# Patient Record
Sex: Female | Born: 1970 | Race: White | Hispanic: No | Marital: Married | Smoking: Never smoker
Health system: Southern US, Community
[De-identification: ages and names within clinical notes are randomized; demographics above are authoritative.]

## PROBLEM LIST (undated history)

## (undated) DIAGNOSIS — R413 Other amnesia: Secondary | ICD-10-CM

## (undated) DIAGNOSIS — N809 Endometriosis, unspecified: Secondary | ICD-10-CM

## (undated) DIAGNOSIS — F3181 Bipolar II disorder: Secondary | ICD-10-CM

## (undated) HISTORY — PX: OTHER SURGICAL HISTORY: SHX169

## (undated) HISTORY — DX: Other amnesia: R41.3

## (undated) HISTORY — PX: DILATION AND CURETTAGE OF UTERUS: SHX78

## (undated) HISTORY — DX: Endometriosis, unspecified: N80.9

## (undated) HISTORY — DX: Bipolar II disorder: F31.81

---

## 1998-06-04 ENCOUNTER — Other Ambulatory Visit: Admission: RE | Admit: 1998-06-04 | Discharge: 1998-06-04 | Payer: Self-pay | Admitting: Gynecology

## 1999-08-10 ENCOUNTER — Other Ambulatory Visit: Admission: RE | Admit: 1999-08-10 | Discharge: 1999-08-10 | Payer: Self-pay | Admitting: Gynecology

## 2000-09-07 ENCOUNTER — Other Ambulatory Visit: Admission: RE | Admit: 2000-09-07 | Discharge: 2000-09-07 | Payer: Self-pay | Admitting: Gynecology

## 2001-09-10 ENCOUNTER — Other Ambulatory Visit: Admission: RE | Admit: 2001-09-10 | Discharge: 2001-09-10 | Payer: Self-pay | Admitting: Gynecology

## 2002-09-25 ENCOUNTER — Other Ambulatory Visit: Admission: RE | Admit: 2002-09-25 | Discharge: 2002-09-25 | Payer: Self-pay | Admitting: Obstetrics and Gynecology

## 2002-12-31 ENCOUNTER — Encounter: Payer: Self-pay | Admitting: Obstetrics and Gynecology

## 2002-12-31 ENCOUNTER — Inpatient Hospital Stay (HOSPITAL_COMMUNITY): Admission: AD | Admit: 2002-12-31 | Discharge: 2002-12-31 | Payer: Self-pay | Admitting: Obstetrics and Gynecology

## 2003-01-13 ENCOUNTER — Inpatient Hospital Stay (HOSPITAL_COMMUNITY): Admission: AD | Admit: 2003-01-13 | Discharge: 2003-01-13 | Payer: Self-pay | Admitting: Obstetrics and Gynecology

## 2003-04-15 ENCOUNTER — Inpatient Hospital Stay (HOSPITAL_COMMUNITY): Admission: AD | Admit: 2003-04-15 | Discharge: 2003-04-17 | Payer: Self-pay | Admitting: Obstetrics and Gynecology

## 2003-06-05 ENCOUNTER — Encounter: Payer: Self-pay | Admitting: Obstetrics and Gynecology

## 2003-06-05 ENCOUNTER — Encounter (INDEPENDENT_AMBULATORY_CARE_PROVIDER_SITE_OTHER): Payer: Self-pay

## 2003-06-05 ENCOUNTER — Ambulatory Visit (HOSPITAL_COMMUNITY): Admission: RE | Admit: 2003-06-05 | Discharge: 2003-06-05 | Payer: Self-pay | Admitting: Obstetrics and Gynecology

## 2003-09-30 ENCOUNTER — Other Ambulatory Visit: Admission: RE | Admit: 2003-09-30 | Discharge: 2003-09-30 | Payer: Self-pay | Admitting: Obstetrics and Gynecology

## 2004-09-30 ENCOUNTER — Other Ambulatory Visit: Admission: RE | Admit: 2004-09-30 | Discharge: 2004-09-30 | Payer: Self-pay | Admitting: Obstetrics and Gynecology

## 2005-05-30 ENCOUNTER — Ambulatory Visit (HOSPITAL_COMMUNITY): Admission: RE | Admit: 2005-05-30 | Discharge: 2005-05-30 | Payer: Self-pay | Admitting: Obstetrics and Gynecology

## 2005-08-27 ENCOUNTER — Inpatient Hospital Stay (HOSPITAL_COMMUNITY): Admission: AD | Admit: 2005-08-27 | Discharge: 2005-08-27 | Payer: Self-pay | Admitting: Obstetrics and Gynecology

## 2005-09-06 ENCOUNTER — Inpatient Hospital Stay (HOSPITAL_COMMUNITY): Admission: AD | Admit: 2005-09-06 | Discharge: 2005-09-08 | Payer: Self-pay | Admitting: Obstetrics and Gynecology

## 2005-10-14 ENCOUNTER — Inpatient Hospital Stay (HOSPITAL_COMMUNITY): Admission: AD | Admit: 2005-10-14 | Discharge: 2005-10-16 | Payer: Self-pay | Admitting: Obstetrics and Gynecology

## 2005-11-11 ENCOUNTER — Other Ambulatory Visit: Admission: RE | Admit: 2005-11-11 | Discharge: 2005-11-11 | Payer: Self-pay | Admitting: Obstetrics and Gynecology

## 2008-05-22 ENCOUNTER — Encounter: Admission: RE | Admit: 2008-05-22 | Discharge: 2008-05-22 | Payer: Self-pay | Admitting: Obstetrics and Gynecology

## 2008-05-27 ENCOUNTER — Encounter: Admission: RE | Admit: 2008-05-27 | Discharge: 2008-05-27 | Payer: Self-pay | Admitting: Obstetrics and Gynecology

## 2008-08-13 ENCOUNTER — Inpatient Hospital Stay (HOSPITAL_COMMUNITY): Admission: AD | Admit: 2008-08-13 | Discharge: 2008-08-13 | Payer: Self-pay | Admitting: Obstetrics and Gynecology

## 2008-11-07 ENCOUNTER — Ambulatory Visit (HOSPITAL_COMMUNITY): Admission: RE | Admit: 2008-11-07 | Discharge: 2008-11-07 | Payer: Self-pay | Admitting: Obstetrics and Gynecology

## 2010-09-19 ENCOUNTER — Encounter: Payer: Self-pay | Admitting: Obstetrics and Gynecology

## 2011-01-14 NOTE — H&P (Signed)
   [name] [name]                           ACCOUNT NO.:  [id number]   MEDICAL RECORD NO.:  [id number]                   PATIENT TYPE:  INP   LOCATION:  9106                                 [facility]   PHYSICIAN:  Osborn Coho, M.D.                DATE OF BIRTH:  [birth year]   DATE OF ADMISSION:  04/15/2003  DATE OF DISCHARGE:                                HISTORY & PHYSICAL   CONTINUATION:   PHYSICAL EXAMINATION:  VITAL SIGNS:  Vital signs are stable.  The patient is  afebrile.  HEENT:  Within normal limits.  LUNGS:  Bilateral breath sounds are clear.  HEART:  Regular rate and rhythm without murmur.  BREASTS:  Soft and nontender.  ABDOMEN:  The fundal height is approximately 37 cm.  Estimated fetal weight  6 to 6-1/2 pounds.  Uterine contractions are every two to four minutes and  moderate to strong quality.  PELVIC:  Cervix 7-8 cm on evaluation in maternity admissions.  The fetal  heart rate is in the 140s by Doppler.  EXTREMITIES:  The deep tendon reflexes are 2+ without clonus.  There is  trace edema noted.   IMPRESSION:  1. Intrauterine pregnancy at 39-1/7 weeks.  2. Active labor, essentially transitional.  3. Desires interventive labor.   PLAN:  1. Admit to birthing suite per consult with Osborn Coho, M.D., as     attending physician.  2. Routine certified nurse midwife orders.  3. The patient has a birth plan that prefers no intervention, including IV,     continuous electronic fetal monitoring, or medication.  This had been     reviewed with the patient in the office and this will be followed as much     as possible based on maternal/fetal status.     [name] [name], C.N.M.                   Osborn Coho, M.D.    VLL/MEDQ  D:  04/15/2003  T:  04/15/2003  Job:  [id number]

## 2011-01-14 NOTE — Discharge Summary (Signed)
[name] [name], [name] [name]                 ACCOUNT NO.:  [id number]   MEDICAL RECORD NO.:  [id number]          PATIENT TYPE:  INP   LOCATION:  9171                          [facility]   PHYSICIAN:  Janine Limbo, M.D.DATE OF BIRTH:  April 18, 1971   DATE OF ADMISSION:  09/06/2005  DATE OF DISCHARGE:  09/08/2005                                 DISCHARGE SUMMARY   ADMITTING DIAGNOSES:  1.  Intrauterine pregnancy at 33 weeks and 5 days.  2.  Preterm labor.   DISCHARGE DIAGNOSES:  1.  Intrauterine pregnancy at [redacted] weeks gestation.  2.  Preterm labor.  3.  Tocolysis.   PROCEDURES:  Magnesium tocolysis.   HOSPITAL COURSE:  Ms. Garn is a 40 year old gravida 2, para 1-0-0-1, who  was admitted at 33 weeks and 5 to [redacted] weeks gestation after evaluation in MAU  secondary to uterine contractions and findings of cervical exam of 3 cm, 80%  effaced, and -2 station.  The patient's pregnancy had been remarkable for  history of infertility with Clomid conception.  History of retained placenta  and negative fetal fibronectin on August 27, 2005.  On August 27, 2005,  the patient had presented to MAU with complaints of a prolonged contraction,  at which time fetal fibronectin and GBS, as well as gonorrhea and chlamydia  cultures were obtained, which were all negative.  The patient underwent  ultrasound at that time, which found a shortened cervix 1.6 cm with a  dilation of internal os to 3 to 4 cm.  Patient without contractions on the  monitor at that time, and she was discharged home.  The patient returned to  the office for routine follow-up OB visit, at which time cervical exam  revealed her cervix to be 3 cm, 80% effaced, and -2 station.  Cervical exam  on August 27, 2005, however, had been closed prior to ultrasound.  Since  this was felt to be a change, the patient was admitted for observation to  MAU.  While being observed in MAU, the patient was noted to have irregular  contractions at  times as often as every two to three minutes.  Therefore, a  decision was made to admit patient for magnesium tocolysis, as well as a  course of betamethasone.  The patient underwent a course of betamethasone.  Throughout patient's hospital stay, patient's contractions remained quite on  magnesium.  Magnesium discontinued six hours prior to discharge without  recurrence of contractions.  Cervical exam on discharge finds cervix to be  unchanged at 3 cm dilated, 80% effaced, and -2 station.  The patient's fetus  has been reactive throughout her hospital stay and reassuring.  It was  determined that patient has received full benefit of her hospital stay and  was discharged home.   DISCHARGE CONDITION:  Stable.   DISCHARGE INSTRUCTIONS:  Patient to maintain level 2 bedrest at the present  time.  The patient was given signs and symptoms of preterm labor, and will  observe for those very carefully, and will call if she has any questions or  concerns.  DISCHARGE MEDICATIONS:  1.  Prenatal vitamins one tab daily.  2.  Terbutaline 2.5 mg every four to six hours as needed for contractions.   DISCHARGE FOLLOW UP:  The patient will return to the office at [facility] on  Tuesday, September 14, 2005 as scheduled for return OB visit.      Rhona Leavens, CNM      Janine Limbo, M.D.  Electronically Signed    NOS/MEDQ  D:  09/08/2005  T:  09/09/2005  Job:  [id number]

## 2011-01-14 NOTE — Op Note (Signed)
   NAMESHALI, VESEY                           ACCOUNT NO.:  [id number]   MEDICAL RECORD NO.:  [id number]                   PATIENT TYPE:  AMB   LOCATION:  SDC                                  [facility]   PHYSICIAN:  Hal Morales, M.D.             DATE OF BIRTH:  11-17-1970   DATE OF PROCEDURE:  06/05/2003  DATE OF DISCHARGE:                                 OPERATIVE REPORT   PREOPERATIVE DIAGNOSIS:  Retained products of conception status post vaginal  delivery in August 2004.   POSTOPERATIVE DIAGNOSIS:  Retained products of conception status post  vaginal delivery in August 2004.   OPERATION:  Suction dilatation and evacuation under ultrasound guidance.   ANESTHESIA:  Monitored anesthesia care and local.   SURGEON:  Hal Morales, M.D.   ESTIMATED BLOOD LOSS:  Less than 25 mL.   COMPLICATIONS:  None.   FINDINGS:  The uterus sounded to 7 cm, there was a small amount of tissue  obtained at the time of D&E.  After the procedure, the endometrial cavity  appeared clear of products of conception.   PROCEDURE:  The patient was taken to the operating room after appropriate  identification and placed on the operating table.  After placement of  equipment for monitored anesthesia care, she was placed in the lithotomy  position.  The perineum and vagina were prepped with multiple layers of  Betadine and draped as a sterile field.  A Graves speculum was placed in the  posterior vagina and a paracervical block achieved with a total of 10 mL of  2% Xylocaine in the 5 and 7 o'clock positions.  A single tooth tenaculum was  then placed.  The cervix was then dilated to accommodate a #7 suction curet  and all quadrants of the uterine cavity were curetted.  Then, under  ultrasound guidance, a sharp curet was used to remove any further products  of conception.  Hemostasis was noted to be adequate and all instruments were  removed from the vagina.  The patient was taken from the  operating room to  the recovery room in satisfactory condition having tolerated the procedure  well with sponge and instrument counts correct.  Specimens to pathology are  endometrial curettings.                                               Hal Morales, M.D.    VPH/MEDQ  D:  06/05/2003  T:  06/05/2003  Job:  [id number]

## 2011-01-14 NOTE — H&P (Signed)
NAMESELENA, [name] [name]                 ACCOUNT NO.:  [id number]   MEDICAL RECORD NO.:  [id number]          PATIENT TYPE:  INP   LOCATION:  9139                          [facility]   PHYSICIAN:  Janine Limbo, M.D.DATE OF BIRTH:  1971/07/09   DATE OF ADMISSION:  10/14/2005  DATE OF DISCHARGE:                                HISTORY & PHYSICAL   This is a 40 year old gravida 2, para 1-0-0-1 at 39-1/7 weeks who presented  to MAU and delivery in active labor which started about 4:30 a.m.  She  presented and was taken straight to labor and delivery at 6 a.m.  Membranes  intact.  Small amount of bloody show.  Positive fetal movement.  Pregnancy  has been followed by the nurse midwife service and remarkable for history of  infertility with Clomid use, history of retained placenta, group B Strep  negative.   ALLERGIES:  None.   OB HISTORY:  Remarkable for spontaneous vaginal delivery in 2004 of a female  infant named Lia at [redacted] weeks gestation weighing 6 pounds 4 ounces.  Remarkable for a rapid labor and retained placenta which caused postpartum  bleeding.   MEDICAL HISTORY:  Remarkable for history of preterm labor with both babies  with term deliveries.  She also has a history of retained placenta and  infertility and childhood varicella.  She has a remote history of depression  at age 81 which is no longer being treated with medications.   FAMILY HISTORY:  Remarkable for a grandfather x2 with heart disease.  Father  and grandmother with diabetes.  Cousin with pituitary disease.  Grandfather  with unknown type of cancer.   SURGICAL HISTORY:  Remarkable for a D&C.   GENETIC HISTORY:  Remarkable for first cousin with neural tube defect.   SOCIAL HISTORY:  Patient is married to [employer] who is involved and  supportive.  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  They do not report a religious affiliation.  They deny any alcohol, tobacco,  or drug use.   PRENATAL LABORATORIES:  Hemoglobin 12.3, platelets 234.  Blood type O+.  Antibody screen negative.  RPR nonreactive.  Rubella immune.  Hepatitis  negative.  Pap test normal.  Cystic fibrosis negative.   HISTORY OF CURRENT PREGNANCY:  Patient entered care at [redacted] weeks gestation.  She declined quad screen.  She did have a level 2 ultrasound at 19 weeks  which was normal.  She had some pelvic pressure at 26 weeks, but no preterm  labor at that time.  A Glucola at 27 weeks was normal.  She had some preterm  labor at 33 weeks with dilation to 3 cm and did well on tocolysis and bed  rest and had a group B Strep negative at term.   OBJECTIVE:  VITAL SIGNS:  Stable.  Afebrile.  HEENT:  Within normal limits.  Thyroid normal, not enlarged.  CHEST:  Clear to auscultation.  HEART:  Regular rate and rhythm.  ABDOMEN:  Gravid.  39 cm.  Vertex to Leopold's.  EFM shows reassuring fetal  heart rate with contractions every one and a half to two minutes.  PELVIC:  Cervix is 9+, completely effaced, and 0 station with a vertex  presentation.  Membranes intact.  EXTREMITIES:  Within normal limits.   ASSESSMENT:  1.  Intrauterine pregnancy at 39-1/7 weeks.  2.  Active labor, transition.  3.  Group B Strep negative.   PLAN:  1.  Admit to birthing suites.  2.  Routine C.N.M. orders.  3.  Anticipate SVD soon.      Marie L. Williams, C.N.M.      Janine Limbo, M.D.  Electronically Signed    MLW/MEDQ  D:  10/14/2005  T:  10/14/2005  Job:  [id number]

## 2011-01-14 NOTE — H&P (Signed)
NAMELOUIE, MEADERS                           ACCOUNT NO.:  [id number]   MEDICAL RECORD NO.:  [id number]                   PATIENT TYPE:  INP   LOCATION:  9106                                 [facility]   PHYSICIAN:  Osborn Coho, M.D.                DATE OF BIRTH:  10-19-1970   DATE OF ADMISSION:  DATE OF DISCHARGE:                                HISTORY & PHYSICAL   REASON FOR ADMISSION:  Ms. Mccarrell is a 40 year old gravida 1, para 0 at 21-  1/7 weeks who presented to the maternity admissions unit in active labor  this morning. She was 7 to 8 cm with contractions every 2 minutes. She began  contracting at 7 a.m. and presented to maternity admissions at 9:30 a.m. She  denied any leaking or bleeding and reported positive fetal movement. Her  cervix had been 3 cm in the office the day before.   Her pregnancy has been remarkable for:  1) Long cycles, 2) History of  infertility, 3) Family history of spina bifida and, 4) Father of baby has  some type of congenital neurological abnormality called cavernous angioma  but has no current problems or issue.   PRENATAL LABORATORY DATA:  Blood type O positive, Rh antibody negative. VDRL  nonreactive. Rubella titer positive. Hepatitis B surface antigen negative.  HIV nonreactive. GC and Chlamydia cultures were negative. Pap smear normal.  Glucose challenge was normal. AFP showed an elevated risk of neural tube  defect risk with normal findings with an ultrasound done at [facility]. She did  have a placenta previa, but the recheck of her AFP was within normal limits.  Previa was resolved in May. Group B Strep culture was negative at 36 weeks.  Hemoglobin upon entering to practice was 13.2; it was 12.4 at 27 weeks.   An EDC of April 21, 2003, was established by last menstrual period and was  in agreement with ultrasound at approximately  18 weeks.   HISTORY OF PRESENT PREGNANCY:  The patient entered care at approximately 10  weeks. She had an  ultrasound at [facility] at 18 weeks secondary to a  family history of neural tube defect. Her abnormal AFP for neural tube  defect was evaluated at [facility] and she had a normal ultrasound except showing  a placenta previa at 18 weeks. There was a recheck of her AFP secondary to  dating and the AFP was normal.   She had left flank pain that began at 24 weeks which was treated with  Motrin. This resolved at approximately  25 weeks. She was evaluated for  contractions at 26 weeks. Fetal fibronectin was negative. She was on  terbutaline p.r.n.   Her cervix at 26 weeks was fingertipped external os, closed internal os,  soft with vertex at a -1 but ballottable. Another fetal fibronectin was done  at 28 weeks secondary to anticipating a trip  to [place]. She also had an  abnormal 1 hour Glucola but had a normal 3 hour test.   An ultrasound at 33 weeks showed normal growth and fluid. She was seen in  the office yesterday on April 14, 2003, and was found to be 3 to 4 cm. Her  birth plan was reviewed, and she was preferring non interventive labor.   OBSTETRIC HISTORY:  The patient is prima gravida.   PAST MEDICAL HISTORY:  1. She was on Desogen until January 2003. She was on Clomid for 4 cycles and     conceived after a cycle of Clomid. Her last cycle was in November 2003.  2. She has had 1 UTI.  3. Her only other  surgery was wisdom teeth.  4. She was in the hospital  once for overnight observation.   ALLERGIES:  No known medication allergies.   FAMILY HISTORY:  Her maternal grandfather is deceased from heart attack. Her  father and paternal grandmother both had type 2 diabetes and her paternal  grandfather is deceased from cancer. Her paternal grandfather was an alcohol  user. Genetic history is remarkable for the patient having 2 first cousins  born with spina bifida. These were born to 2 different aunts. The father of  the baby also has a history of a condition called cavernous  angioma which  causes seizures. This was diagnosed 2 years ago but there are no current  problems or medications. The father of  the baby also has a strong family  history of cancer.   SOCIAL HISTORY:  The patient is married to the father of the  baby. He is  involved and supportive. His name is [employer] known as Pharmacologist. She is  Caucasian. 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  Her husband also has a Environmental manager and he is employed as a Runner, broadcasting/film/video at  [employer]. She denies any alcohol, drug or tobacco use during  this pregnancy. She has been followed  by the certified nurse midwife  service at [facility].   PHYSICAL EXAMINATION:  VITAL SIGNS:  Stable, the patient is afebrile.  HEENT:  Within normal limits.  LUNGS:  Breath sounds are clear.  HEART:  Regular rate and rhythm without murmur.   Dictation ended at this point.       [name], C.N.M.                   Osborn Coho, M.D.    VLL/MEDQ  D:  04/15/2003  T:  04/15/2003  Job:  [id number]

## 2011-01-14 NOTE — H&P (Signed)
[name], [name]                 ACCOUNT NO.:  [id number]   MEDICAL RECORD NO.:  [id number]          PATIENT TYPE:  INP   LOCATION:  9171                          [facility]   PHYSICIAN:  Naima A. Dillard, M.D. DATE OF BIRTH:  08-12-1971   DATE OF ADMISSION:  09/06/2005  DATE OF DISCHARGE:                                HISTORY & PHYSICAL   [name] [name] is a 40 year old, gravida 2, para 1-0-0-1 who presents at 32-  5/7th's weeks' gestation, EDD October 23, 2005.  She presents from the  office of [facility] for admission for preterm labor.  She reports irregular  contractions, positive fetal movement, no vaginal bleeding, no rupture of  membranes.  The patient is dilated to 3-cm on exam.  She was seen, on  August 27, 2005, in MAU within a negative fetal fibronectin at that time.  She denies any headache, visual changes, or epigastric pain.   Her pregnancy has been followed by the C.N.M. service at [facility] and is  remarkable for:  1.  History of infertility/Clomid conception.  2.  History of retained placenta.  3.  Negative fetal fibronectin on August 27, 2005.   This patient initiated prenatal care at [facility], on March 28, 2005, at 10 weeks'  gestation by dates.  EDC confirmed by early pregnancy ultrasound at 10 weeks  1 day.  The patient's pregnancy to this point has been unremarkable until  August 27, 2005.  On that day, the patient woke up from her sleep with  contractions and was seen for evaluation in maternity admissions.  A fetal  fibronectin was negative at that time.  Contractions were irregular.  Ultrasound exam of the fetus at that time found a cephalic presentation with  normal AFI at 12.2-cm.  BPP was 8 out of 8.  However, at that time her  cervix measured 1.6-cm trans labial with dilation of the internal os to 3.5-  cm.  In light of her negative fetal fibronectin and stable condition at that  time, she was discharged home.  She has been normotensive throughout her  pregnancy with no proteinuria.   PRENATAL LABORATORY WORK:  On March 23, 2005, hemoglobin and hematocrit 12.3  and 37.0, platelets 234,000.  Blood type and Rh O positive.  Antibody screen  negative.  VDRL nonreactive.  Rubella immune.  Hepatitis B surface antigen  negative.  Pap smear within normal limits.  CF testing negative.  At 28  weeks, one-hour glucose challenge 126.  RPR within normal limits.   OBSTETRICAL HISTORY:  In 2004, the patient had a normal spontaneous vaginal  delivery with the birth of a 6 pounds 4 ounces female infant named [name] [name] with  no complications.  This is her second and current pregnancy.   She has no known drug allergies and denies the use of tobacco, alcohol, or  illicit drugs   MEDICAL HISTORY:  1.  The patient did experience some preterm contractions with her first      pregnancy at approximately 27 weeks.  She used terbutaline at that time      and  went on to deliver a term pregnancy.  Following of her daughter's      birth, she did have retained placenta and returned to the OR for a D&C.  2.  She has a history of infertility since that time and took Clomid to      conceive this pregnancy.  3.  She does have a history of depression in the past but has not taken any      medications and she was 40 years old.   FAMILY HISTORY:  Paternal grandfather CHF, deceased.  Maternal grandfather  heart attack, deceased.  Father and paternal grandmother with a history of  diabetes.  Maternal grandfather with cancer of unknown type, deceased.   GENETIC HISTORY:  There is no genetic history of familial or chromosomal  disorders, children that were born with birth defects or any that died in  infancy.   SOCIAL HISTORY:  [name] [name] is a married Caucasian female.  She works as an  Airline pilot.  Her husband is a Runner, broadcasting/film/video.  His name is [employer].  He is  involved and supportive.  They do not subscribe to a religious faith.   REVIEW OF SYSTEMS:  As described above.  The  patient is typical of one with  the uterine pregnancy at 33-5/7th's weeks in preterm labor.   PHYSICAL EXAMINATION:  VITAL SIGNS:  Stable.  The patient is afebrile.  HEENT:  Unremarkable.  HEART:  Regular rate and rhythm.  LUNGS:  Clear.  ABDOMEN:  Gravid in its contour.  Uterine fundus is noted to extend 34-cm  above the level of the pubic symphysis.  Thayer Ohm maneuvers finds the infant  to be in a longitudinal lie, cephalic presentation, and the estimated fetal  weight is 4 to 4 and 1/2 pounds.  The baseline of the fetal heart rate  monitor is 146 with average long-term variability.  Reactivity is present  with no periodic changes. The patient was initially contracting three times  per hour with uterine irritability.  As she has remained in maternity  admissions, that has increased at times to contractions every two to three  minutes and will again space out.  The patient's abdomen is soft and  nontender.  Negative CVA tenderness bilaterally.  PELVIC:  Her cervix on exam at the office of [facility] was 3-cm dilated, 80%  effaced with the vertex at a -2 station.  EXTREMITIES:  Show no pathologic edema.  There is no calf tenderness  bilaterally.   Ultrasound exam, on August 27, 2005, found the fetus to be in a vertex  presentation, AFI 12.2-cm.  BPP 8 out of 8.  Cervix measured 1.67-cm  translabial with dilation of the internal os to 3.5-cm.  Fetal fibronectin  at that time was negative.   ASSESSMENT:  1.  Intrauterine pregnancy at 33-5/7th's weeks.  2.  Preterm labor.   PLAN:  1.  Admit per Dr. Jaymes Graff.  2.  Begin magnesium sulfate 4 gram bolus then 2 grams per hour continuously.  3.  Betamethasone 12.5 mg IM x1 received here in MAU, to be repeated in 24      hours.  4.  Will monitor continuously for contractions with further orders as      written      Rica Koyanagi, C.N.M.      Naima A. Normand Sloop, M.D. Electronically Signed   SDM/MEDQ  D:  09/06/2005  T:   09/06/2005  Job:  [id number]

## 2011-06-02 LAB — CBC
HCT: 35.6 % — ABNORMAL LOW (ref 36.0–46.0)
Platelets: 193 10*3/uL (ref 150–400)
RBC: 3.81 MIL/uL — ABNORMAL LOW (ref 3.87–5.11)
WBC: 10.8 10*3/uL — ABNORMAL HIGH (ref 4.0–10.5)

## 2011-06-02 LAB — WET PREP, GENITAL
Clue Cells Wet Prep HPF POC: NONE SEEN
Trich, Wet Prep: NONE SEEN

## 2011-06-02 LAB — HCG, QUANTITATIVE, PREGNANCY: hCG, Beta Chain, Quant, S: 51883 m[IU]/mL — ABNORMAL HIGH (ref <5)

## 2011-06-02 LAB — ABO/RH: ABO/RH(D): O POS

## 2012-02-15 ENCOUNTER — Ambulatory Visit: Payer: Self-pay | Admitting: Sports Medicine

## 2013-09-30 ENCOUNTER — Other Ambulatory Visit: Payer: Self-pay | Admitting: Obstetrics and Gynecology

## 2014-01-07 ENCOUNTER — Other Ambulatory Visit: Payer: Self-pay | Admitting: Obstetrics and Gynecology

## 2014-01-07 DIAGNOSIS — N644 Mastodynia: Secondary | ICD-10-CM

## 2014-01-17 ENCOUNTER — Ambulatory Visit
Admission: RE | Admit: 2014-01-17 | Discharge: 2014-01-17 | Disposition: A | Discharge status: Home / Self Care | Payer: BC Managed Care – PPO | Source: Ambulatory Visit | Attending: Obstetrics and Gynecology | Admitting: Obstetrics and Gynecology

## 2014-01-17 ENCOUNTER — Encounter (INDEPENDENT_AMBULATORY_CARE_PROVIDER_SITE_OTHER): Payer: Self-pay

## 2014-01-17 DIAGNOSIS — N644 Mastodynia: Secondary | ICD-10-CM

## 2014-04-10 ENCOUNTER — Other Ambulatory Visit: Payer: Self-pay | Admitting: Obstetrics and Gynecology

## 2014-04-11 LAB — CYTOLOGY - PAP

## 2015-07-31 ENCOUNTER — Other Ambulatory Visit: Payer: Self-pay

## 2015-07-31 DIAGNOSIS — Z1231 Encounter for screening mammogram for malignant neoplasm of breast: Secondary | ICD-10-CM

## 2015-08-03 ENCOUNTER — Ambulatory Visit
Admission: RE | Admit: 2015-08-03 | Discharge: 2015-08-03 | Disposition: A | Discharge status: Home / Self Care | Payer: 59 | Source: Ambulatory Visit

## 2015-08-03 DIAGNOSIS — Z1231 Encounter for screening mammogram for malignant neoplasm of breast: Secondary | ICD-10-CM

## 2016-05-25 ENCOUNTER — Other Ambulatory Visit: Payer: Self-pay | Admitting: Obstetrics and Gynecology

## 2016-05-25 DIAGNOSIS — Z1231 Encounter for screening mammogram for malignant neoplasm of breast: Secondary | ICD-10-CM

## 2016-07-26 DIAGNOSIS — F3181 Bipolar II disorder: Secondary | ICD-10-CM | POA: Diagnosis not present

## 2016-07-29 DIAGNOSIS — E559 Vitamin D deficiency, unspecified: Secondary | ICD-10-CM | POA: Diagnosis not present

## 2016-07-29 DIAGNOSIS — Z5181 Encounter for therapeutic drug level monitoring: Secondary | ICD-10-CM | POA: Diagnosis not present

## 2016-08-04 ENCOUNTER — Encounter: Payer: Self-pay | Admitting: Radiology

## 2016-08-04 ENCOUNTER — Ambulatory Visit
Admission: RE | Admit: 2016-08-04 | Discharge: 2016-08-04 | Disposition: A | Discharge status: Home / Self Care | Payer: BLUE CROSS/BLUE SHIELD | Source: Ambulatory Visit | Attending: Obstetrics and Gynecology | Admitting: Obstetrics and Gynecology

## 2016-08-04 DIAGNOSIS — Z1231 Encounter for screening mammogram for malignant neoplasm of breast: Secondary | ICD-10-CM | POA: Diagnosis not present

## 2016-09-06 DIAGNOSIS — R87610 Atypical squamous cells of undetermined significance on cytologic smear of cervix (ASC-US): Secondary | ICD-10-CM | POA: Diagnosis not present

## 2016-09-06 DIAGNOSIS — Z6823 Body mass index (BMI) 23.0-23.9, adult: Secondary | ICD-10-CM | POA: Diagnosis not present

## 2016-09-06 DIAGNOSIS — Z01419 Encounter for gynecological examination (general) (routine) without abnormal findings: Secondary | ICD-10-CM | POA: Diagnosis not present

## 2016-09-16 DIAGNOSIS — F3181 Bipolar II disorder: Secondary | ICD-10-CM | POA: Diagnosis not present

## 2016-11-09 DIAGNOSIS — F3181 Bipolar II disorder: Secondary | ICD-10-CM | POA: Diagnosis not present

## 2016-12-18 DIAGNOSIS — R509 Fever, unspecified: Secondary | ICD-10-CM | POA: Diagnosis not present

## 2016-12-18 DIAGNOSIS — J111 Influenza due to unidentified influenza virus with other respiratory manifestations: Secondary | ICD-10-CM | POA: Diagnosis not present

## 2016-12-26 DIAGNOSIS — R091 Pleurisy: Secondary | ICD-10-CM | POA: Diagnosis not present

## 2016-12-26 DIAGNOSIS — B349 Viral infection, unspecified: Secondary | ICD-10-CM | POA: Diagnosis not present

## 2017-02-15 DIAGNOSIS — F3181 Bipolar II disorder: Secondary | ICD-10-CM | POA: Diagnosis not present

## 2017-03-29 DIAGNOSIS — F3181 Bipolar II disorder: Secondary | ICD-10-CM | POA: Diagnosis not present

## 2017-06-01 DIAGNOSIS — F3181 Bipolar II disorder: Secondary | ICD-10-CM | POA: Diagnosis not present

## 2017-06-06 ENCOUNTER — Other Ambulatory Visit: Payer: Self-pay | Admitting: Obstetrics and Gynecology

## 2017-06-06 DIAGNOSIS — Z1231 Encounter for screening mammogram for malignant neoplasm of breast: Secondary | ICD-10-CM

## 2017-08-07 ENCOUNTER — Ambulatory Visit: Payer: BLUE CROSS/BLUE SHIELD

## 2017-08-09 ENCOUNTER — Ambulatory Visit: Payer: BLUE CROSS/BLUE SHIELD

## 2017-08-31 DIAGNOSIS — F3181 Bipolar II disorder: Secondary | ICD-10-CM | POA: Diagnosis not present

## 2017-09-07 DIAGNOSIS — Z13220 Encounter for screening for lipoid disorders: Secondary | ICD-10-CM | POA: Diagnosis not present

## 2017-09-07 DIAGNOSIS — Z1329 Encounter for screening for other suspected endocrine disorder: Secondary | ICD-10-CM | POA: Diagnosis not present

## 2017-09-07 DIAGNOSIS — Z0142 Encounter for cervical smear to confirm findings of recent normal smear following initial abnormal smear: Secondary | ICD-10-CM | POA: Diagnosis not present

## 2017-09-07 DIAGNOSIS — Z01419 Encounter for gynecological examination (general) (routine) without abnormal findings: Secondary | ICD-10-CM | POA: Diagnosis not present

## 2017-09-07 DIAGNOSIS — Z13228 Encounter for screening for other metabolic disorders: Secondary | ICD-10-CM | POA: Diagnosis not present

## 2017-09-07 DIAGNOSIS — Z6823 Body mass index (BMI) 23.0-23.9, adult: Secondary | ICD-10-CM | POA: Diagnosis not present

## 2017-09-07 DIAGNOSIS — Z1321 Encounter for screening for nutritional disorder: Secondary | ICD-10-CM | POA: Diagnosis not present

## 2017-09-11 ENCOUNTER — Ambulatory Visit
Admission: RE | Admit: 2017-09-11 | Discharge: 2017-09-11 | Disposition: A | Discharge status: Home / Self Care | Payer: BLUE CROSS/BLUE SHIELD | Source: Ambulatory Visit | Attending: Obstetrics and Gynecology | Admitting: Obstetrics and Gynecology

## 2017-09-11 DIAGNOSIS — Z1231 Encounter for screening mammogram for malignant neoplasm of breast: Secondary | ICD-10-CM | POA: Diagnosis not present

## 2017-09-12 ENCOUNTER — Other Ambulatory Visit: Payer: Self-pay | Admitting: Obstetrics and Gynecology

## 2017-09-12 DIAGNOSIS — R928 Other abnormal and inconclusive findings on diagnostic imaging of breast: Secondary | ICD-10-CM

## 2017-09-14 ENCOUNTER — Ambulatory Visit
Admission: RE | Admit: 2017-09-14 | Discharge: 2017-09-14 | Disposition: A | Discharge status: Home / Self Care | Payer: BLUE CROSS/BLUE SHIELD | Source: Ambulatory Visit | Attending: Obstetrics and Gynecology | Admitting: Obstetrics and Gynecology

## 2017-09-14 DIAGNOSIS — R922 Inconclusive mammogram: Secondary | ICD-10-CM | POA: Diagnosis not present

## 2017-09-14 DIAGNOSIS — R928 Other abnormal and inconclusive findings on diagnostic imaging of breast: Secondary | ICD-10-CM

## 2017-09-14 DIAGNOSIS — N6002 Solitary cyst of left breast: Secondary | ICD-10-CM | POA: Diagnosis not present

## 2018-01-02 DIAGNOSIS — F3181 Bipolar II disorder: Secondary | ICD-10-CM | POA: Diagnosis not present

## 2018-07-04 DIAGNOSIS — F3181 Bipolar II disorder: Secondary | ICD-10-CM | POA: Diagnosis not present

## 2018-08-06 ENCOUNTER — Other Ambulatory Visit: Payer: Self-pay | Admitting: Obstetrics and Gynecology

## 2018-08-06 DIAGNOSIS — Z1231 Encounter for screening mammogram for malignant neoplasm of breast: Secondary | ICD-10-CM

## 2018-09-13 ENCOUNTER — Ambulatory Visit
Admission: RE | Admit: 2018-09-13 | Discharge: 2018-09-13 | Disposition: A | Discharge status: Home / Self Care | Payer: BLUE CROSS/BLUE SHIELD | Source: Ambulatory Visit | Attending: Obstetrics and Gynecology | Admitting: Obstetrics and Gynecology

## 2018-09-13 DIAGNOSIS — Z1231 Encounter for screening mammogram for malignant neoplasm of breast: Secondary | ICD-10-CM

## 2018-10-08 DIAGNOSIS — J029 Acute pharyngitis, unspecified: Secondary | ICD-10-CM | POA: Diagnosis not present

## 2018-10-08 DIAGNOSIS — R509 Fever, unspecified: Secondary | ICD-10-CM | POA: Diagnosis not present

## 2018-10-11 DIAGNOSIS — Z6824 Body mass index (BMI) 24.0-24.9, adult: Secondary | ICD-10-CM | POA: Diagnosis not present

## 2018-10-11 DIAGNOSIS — Z01419 Encounter for gynecological examination (general) (routine) without abnormal findings: Secondary | ICD-10-CM | POA: Diagnosis not present

## 2019-01-01 DIAGNOSIS — F3181 Bipolar II disorder: Secondary | ICD-10-CM | POA: Diagnosis not present

## 2019-07-02 DIAGNOSIS — F3181 Bipolar II disorder: Secondary | ICD-10-CM | POA: Diagnosis not present

## 2019-08-12 ENCOUNTER — Other Ambulatory Visit: Payer: Self-pay

## 2019-08-12 DIAGNOSIS — Z20822 Contact with and (suspected) exposure to covid-19: Secondary | ICD-10-CM

## 2019-08-15 LAB — NOVEL CORONAVIRUS, NAA: SARS-CoV-2, NAA: DETECTED — AB

## 2019-09-04 ENCOUNTER — Other Ambulatory Visit: Payer: Self-pay | Admitting: Obstetrics and Gynecology

## 2019-09-04 DIAGNOSIS — Z1231 Encounter for screening mammogram for malignant neoplasm of breast: Secondary | ICD-10-CM

## 2019-10-16 ENCOUNTER — Ambulatory Visit
Admission: RE | Admit: 2019-10-16 | Discharge: 2019-10-16 | Disposition: A | Discharge status: Home / Self Care | Payer: BC Managed Care – PPO | Source: Ambulatory Visit | Attending: Obstetrics and Gynecology | Admitting: Obstetrics and Gynecology

## 2019-10-16 ENCOUNTER — Other Ambulatory Visit: Payer: Self-pay

## 2019-10-16 DIAGNOSIS — Z1231 Encounter for screening mammogram for malignant neoplasm of breast: Secondary | ICD-10-CM | POA: Diagnosis not present

## 2019-10-22 DIAGNOSIS — Z23 Encounter for immunization: Secondary | ICD-10-CM | POA: Diagnosis not present

## 2019-10-22 DIAGNOSIS — Z01419 Encounter for gynecological examination (general) (routine) without abnormal findings: Secondary | ICD-10-CM | POA: Diagnosis not present

## 2019-10-22 DIAGNOSIS — Z6823 Body mass index (BMI) 23.0-23.9, adult: Secondary | ICD-10-CM | POA: Diagnosis not present

## 2019-10-26 ENCOUNTER — Ambulatory Visit: Payer: BC Managed Care – PPO | Attending: Internal Medicine

## 2019-10-26 DIAGNOSIS — Z23 Encounter for immunization: Secondary | ICD-10-CM | POA: Insufficient documentation

## 2019-10-26 NOTE — Progress Notes (Signed)
   Covid-19 Vaccination Clinic  Name:  [name] [name]    MRN: [id number] DOB: 1970/12/14  10/26/2019  [name] was observed post Covid-19 immunization for 15 minutes without incidence. She was provided with Vaccine Information Sheet and instruction to access the V-Safe system.   [name] was instructed to call 911 with any severe reactions post vaccine: Marland Kitchen Difficulty breathing  . Swelling of your face and throat  . A fast heartbeat  . A bad rash all over your body  . Dizziness and weakness    Immunizations Administered    Name Date Dose VIS Date Route   Pfizer COVID-19 Vaccine 10/26/2019  5:48 PM 0.3 mL 08/09/2019 Intramuscular   Manufacturer: [place]   Lot: [id number]   [place]: [id number]

## 2019-11-16 ENCOUNTER — Ambulatory Visit: Payer: BC Managed Care – PPO

## 2019-11-16 ENCOUNTER — Ambulatory Visit: Payer: BC Managed Care – PPO | Attending: Internal Medicine

## 2019-11-16 DIAGNOSIS — Z23 Encounter for immunization: Secondary | ICD-10-CM

## 2019-11-16 NOTE — Progress Notes (Signed)
   Covid-19 Vaccination Clinic  Name:  [name] [name]    MRN: [id number] DOB: [birth year]  11/16/2019  [name] [name] was observed post Covid-19 immunization for 15 minutes without incident. She was provided with Vaccine Information Sheet and instruction to access the V-Safe system.   [name] [name] was instructed to call 911 with any severe reactions post vaccine: Marland Kitchen Difficulty breathing  . Swelling of face and throat  . A fast heartbeat  . A bad rash all over body  . Dizziness and weakness   Immunizations Administered    Name Date Dose VIS Date Route   Pfizer COVID-19 Vaccine 11/16/2019 10:59 AM 0.3 mL 08/09/2019 Intramuscular   Manufacturer: [place]   Lot: [id number]   [place]: [id number]

## 2019-11-20 ENCOUNTER — Ambulatory Visit: Payer: BC Managed Care – PPO

## 2019-12-20 ENCOUNTER — Other Ambulatory Visit: Payer: Self-pay

## 2019-12-20 ENCOUNTER — Ambulatory Visit: Payer: BC Managed Care – PPO | Admitting: Physician Assistant

## 2019-12-20 ENCOUNTER — Encounter: Payer: Self-pay | Admitting: Physician Assistant

## 2019-12-20 DIAGNOSIS — L820 Inflamed seborrheic keratosis: Secondary | ICD-10-CM

## 2019-12-20 DIAGNOSIS — Z808 Family history of malignant neoplasm of other organs or systems: Secondary | ICD-10-CM | POA: Diagnosis not present

## 2019-12-20 DIAGNOSIS — L570 Actinic keratosis: Secondary | ICD-10-CM | POA: Diagnosis not present

## 2019-12-20 DIAGNOSIS — L918 Other hypertrophic disorders of the skin: Secondary | ICD-10-CM | POA: Diagnosis not present

## 2019-12-20 NOTE — Progress Notes (Signed)
   Follow up Visit  [place] is a 49 y.o. female who presents for the following: Annual Exam (Skin check concerns right side back, tops of thighs. Patient has skin tag above bikini line. ). Last visit was in 2016. Mole on the right side and right inner thigh have  been there for 6 months has seemed to change color and become rough. No pain or itch. Skin tag on bikini line is getting rubbed.  Mom has a history of skin cancer and pt has had a precancer above upper lip.   Objective  Well appearing patient in no apparent distress; mood and affect are within normal limits.  A full examination was performed including head, eyes, ears, nose, lips, neck, chest, axillae, abdomen, back, buttocks, bilateral upper extremities, bilateral lower extremities, hands, feet, fingers, toes, fingernails, and toenails. All findings within normal limits unless otherwise noted below. No suspicious moles noted on back.   Objective  Mid Upper Vermilion Lip, Right Upper Vermilion Lip: Erythematous patches with gritty scale.  Objective  Abdomen (4), Left Lower Back (2), Right Lower Back, Right Thigh - Anterior (3): Erythematous stuck-on, waxy papule or plaque.   Objective  Right Suprapubic Area: Fleshy, skin-colored sessile and pedunculated papules.    Assessment & Plan  AK (actinic keratosis) (2) Right Upper Vermilion Lip; Mid Upper Vermilion Lip  Destruction of lesion - Mid Upper Vermilion Lip, Right Upper Vermilion Lip Complexity: simple   Destruction method: cryotherapy   Informed consent: discussed and consent obtained   Timeout:  patient name, date of birth, surgical site, and procedure verified Lesion destroyed using liquid nitrogen: Yes   Outcome: patient tolerated procedure well with no complications    Inflamed seborrheic keratosis (10) Right Thigh - Anterior (3); Abdomen (4); Left Lower Back (2); Right Lower Back  Destruction of lesion - Abdomen, Left Lower Back, Right  Lower Back, Right Thigh - Anterior Complexity: simple   Destruction method: cryotherapy   Informed consent: discussed and consent obtained   Timeout:  patient name, date of birth, surgical site, and procedure verified Lesion destroyed using liquid nitrogen: Yes   Outcome: patient tolerated procedure well with no complications    Skin tag Right Suprapubic Area  Epidermal / dermal shaving - Right Suprapubic Area  Informed consent: discussed and consent obtained   Timeout: patient name, date of birth, surgical site, and procedure verified   Instrument used: scissors   Hemostasis achieved with: ferric subsulfate   Outcome: patient tolerated procedure well

## 2019-12-31 DIAGNOSIS — F3181 Bipolar II disorder: Secondary | ICD-10-CM | POA: Diagnosis not present

## 2020-03-16 DIAGNOSIS — E559 Vitamin D deficiency, unspecified: Secondary | ICD-10-CM | POA: Diagnosis not present

## 2020-03-16 DIAGNOSIS — Z5181 Encounter for therapeutic drug level monitoring: Secondary | ICD-10-CM | POA: Diagnosis not present

## 2020-04-15 DIAGNOSIS — Z03818 Encounter for observation for suspected exposure to other biological agents ruled out: Secondary | ICD-10-CM | POA: Diagnosis not present

## 2020-06-30 DIAGNOSIS — F3181 Bipolar II disorder: Secondary | ICD-10-CM | POA: Diagnosis not present

## 2020-07-28 DIAGNOSIS — L570 Actinic keratosis: Secondary | ICD-10-CM | POA: Diagnosis not present

## 2020-07-28 DIAGNOSIS — L821 Other seborrheic keratosis: Secondary | ICD-10-CM | POA: Diagnosis not present

## 2020-08-31 DIAGNOSIS — Z03818 Encounter for observation for suspected exposure to other biological agents ruled out: Secondary | ICD-10-CM | POA: Diagnosis not present

## 2020-09-23 DIAGNOSIS — Z0000 Encounter for general adult medical examination without abnormal findings: Secondary | ICD-10-CM | POA: Diagnosis not present

## 2020-09-23 DIAGNOSIS — Z131 Encounter for screening for diabetes mellitus: Secondary | ICD-10-CM | POA: Diagnosis not present

## 2020-09-23 DIAGNOSIS — Z13220 Encounter for screening for lipoid disorders: Secondary | ICD-10-CM | POA: Diagnosis not present

## 2020-09-23 DIAGNOSIS — R635 Abnormal weight gain: Secondary | ICD-10-CM | POA: Diagnosis not present

## 2020-10-09 DIAGNOSIS — Z1211 Encounter for screening for malignant neoplasm of colon: Secondary | ICD-10-CM | POA: Diagnosis not present

## 2020-10-21 ENCOUNTER — Other Ambulatory Visit: Payer: Self-pay | Admitting: Obstetrics and Gynecology

## 2020-10-21 DIAGNOSIS — Z1231 Encounter for screening mammogram for malignant neoplasm of breast: Secondary | ICD-10-CM

## 2020-10-23 DIAGNOSIS — Z01419 Encounter for gynecological examination (general) (routine) without abnormal findings: Secondary | ICD-10-CM | POA: Diagnosis not present

## 2020-10-23 DIAGNOSIS — Z6826 Body mass index (BMI) 26.0-26.9, adult: Secondary | ICD-10-CM | POA: Diagnosis not present

## 2020-10-28 ENCOUNTER — Other Ambulatory Visit: Payer: Self-pay | Admitting: Obstetrics and Gynecology

## 2020-10-28 DIAGNOSIS — Z1231 Encounter for screening mammogram for malignant neoplasm of breast: Secondary | ICD-10-CM

## 2020-10-29 ENCOUNTER — Inpatient Hospital Stay: Admission: RE | Admit: 2020-10-29 | Payer: BC Managed Care – PPO | Source: Ambulatory Visit

## 2020-10-29 DIAGNOSIS — Z1231 Encounter for screening mammogram for malignant neoplasm of breast: Secondary | ICD-10-CM

## 2020-11-03 ENCOUNTER — Ambulatory Visit
Admission: RE | Admit: 2020-11-03 | Discharge: 2020-11-03 | Disposition: A | Discharge status: Home / Self Care | Payer: BC Managed Care – PPO | Source: Ambulatory Visit

## 2020-11-03 DIAGNOSIS — Z1231 Encounter for screening mammogram for malignant neoplasm of breast: Secondary | ICD-10-CM | POA: Diagnosis not present

## 2020-12-21 ENCOUNTER — Ambulatory Visit: Payer: BC Managed Care – PPO | Admitting: Dermatology

## 2020-12-21 ENCOUNTER — Ambulatory Visit: Payer: BC Managed Care – PPO | Admitting: Physician Assistant

## 2020-12-21 ENCOUNTER — Other Ambulatory Visit: Payer: Self-pay

## 2020-12-21 ENCOUNTER — Encounter: Payer: Self-pay | Admitting: Dermatology

## 2020-12-21 DIAGNOSIS — L570 Actinic keratosis: Secondary | ICD-10-CM

## 2020-12-21 DIAGNOSIS — R202 Paresthesia of skin: Secondary | ICD-10-CM | POA: Diagnosis not present

## 2020-12-21 DIAGNOSIS — Z1283 Encounter for screening for malignant neoplasm of skin: Secondary | ICD-10-CM | POA: Diagnosis not present

## 2020-12-21 MED ORDER — KLISYRI 1 % EX OINT: 1.0000 "application " | TOPICAL_OINTMENT | Freq: Every day | CUTANEOUS | 0 refills | Status: DC

## 2020-12-21 NOTE — Patient Instructions (Signed)
Klisyri (tirbanibulin)  Can try [email or web address] for coupon online

## 2020-12-22 DIAGNOSIS — D3131 Benign neoplasm of right choroid: Secondary | ICD-10-CM | POA: Diagnosis not present

## 2020-12-24 DIAGNOSIS — R413 Other amnesia: Secondary | ICD-10-CM | POA: Diagnosis not present

## 2020-12-28 ENCOUNTER — Encounter: Payer: Self-pay | Admitting: Dermatology

## 2020-12-29 DIAGNOSIS — F3181 Bipolar II disorder: Secondary | ICD-10-CM | POA: Diagnosis not present

## 2020-12-29 NOTE — Progress Notes (Signed)
   Follow-Up Visit   Subjective  [name] [name] is a 50 y.o. female who presents for the following: Annual Exam (Dry patch mid back scale x months also upper lip pervious ln2 per patient still scale).  General skin examination Location:  Duration:  Quality: Persistent scale over lip after freezing. Associated Signs/Symptoms: Modifying Factors:  Severity:  Timing: Context:   Objective  Well appearing patient in no apparent distress; mood and affect are within normal limits. Objective  Left Inguinal Area: General skin examination.  No skin cancer no atypical moles today  Objective  Left Upper Back: Area with recurring pruritus without visible rash or inflamed growths.  Best fits a neurogenic itch.    Objective  Mid Upper Vermilion Lip: Persistent subtle pink gritty scale for millimeter    A full examination was performed including scalp, head, eyes, ears, nose, lips, neck, chest, axillae, abdomen, back, buttocks, bilateral upper extremities, bilateral lower extremities, hands, feet, fingers, toes, fingernails, and toenails. All findings within normal limits unless otherwise noted below.  Areas beneath undergarments not fully examined.   Assessment & Plan    Screening exam for skin cancer Left Inguinal Area  Annual skin examination.  Encouraged to self examine twice annually.  Continued ultraviolet protection.  Notalgia paresthetica Left Upper Back  Look for any over-the-counter anti-itch lotion containing the ingredient pramoxine.  Given sample of CeraVe itch relief.  AK (actinic keratosis) Mid Upper Vermilion Lip  I will try of 5 nights of Klisyri.  If this fails, consider biopsy.  Ordered Medications: Tirbanibulin (KLISYRI) 1 % OINT      I, Lavonna Monarch, MD, have reviewed all documentation for this visit.  The documentation on 12/29/20 for the exam, diagnosis, procedures, and orders are all accurate and complete.

## 2021-05-27 DIAGNOSIS — Z1211 Encounter for screening for malignant neoplasm of colon: Secondary | ICD-10-CM | POA: Diagnosis not present

## 2021-06-25 DIAGNOSIS — M79605 Pain in left leg: Secondary | ICD-10-CM | POA: Diagnosis not present

## 2021-07-01 DIAGNOSIS — F3181 Bipolar II disorder: Secondary | ICD-10-CM | POA: Diagnosis not present

## 2021-08-23 IMAGING — MG MM DIGITAL SCREENING BILAT W/ TOMO AND CAD
8 series · 9 of 24 positions shown · non-contrast
Comparison: Previous exam(s).

CLINICAL DATA: Screening.

EXAM:
DIGITAL SCREENING BILATERAL MAMMOGRAM WITH TOMOSYNTHESIS AND CAD
TECHNIQUE: Bilateral screening digital craniocaudal and mediolateral oblique
mammograms were obtained. Bilateral screening digital breast
tomosynthesis was performed. The images were evaluated with
computer-aided detection.

[L MLO synth-2D]
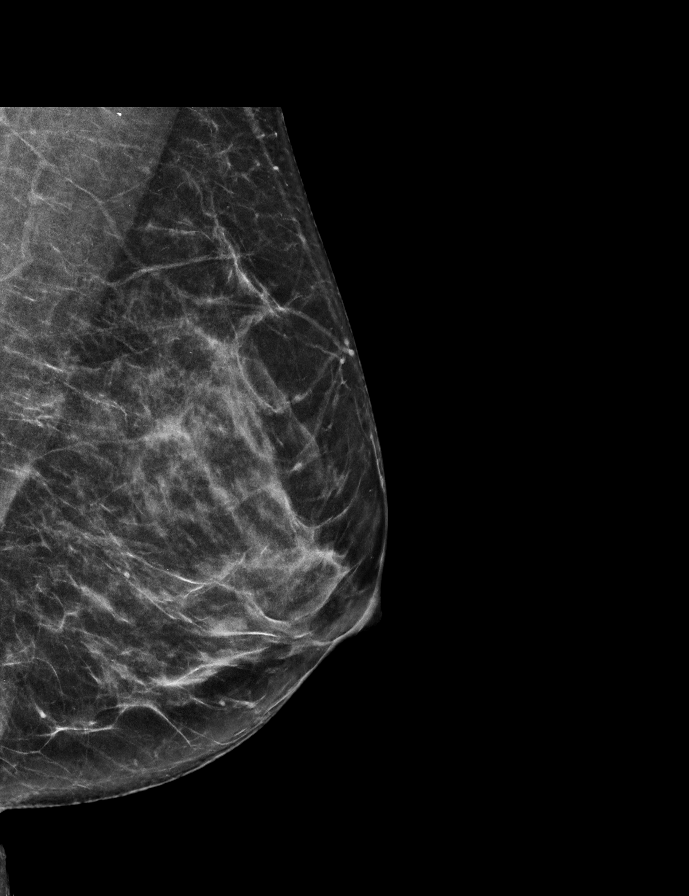

[R MLO synth-2D]
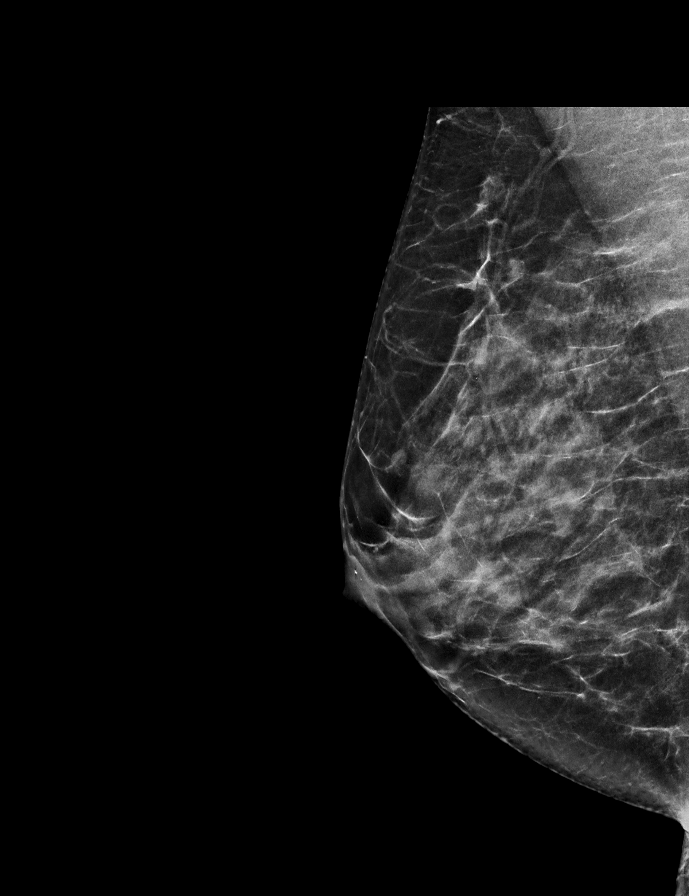

[R CC synth-2D]
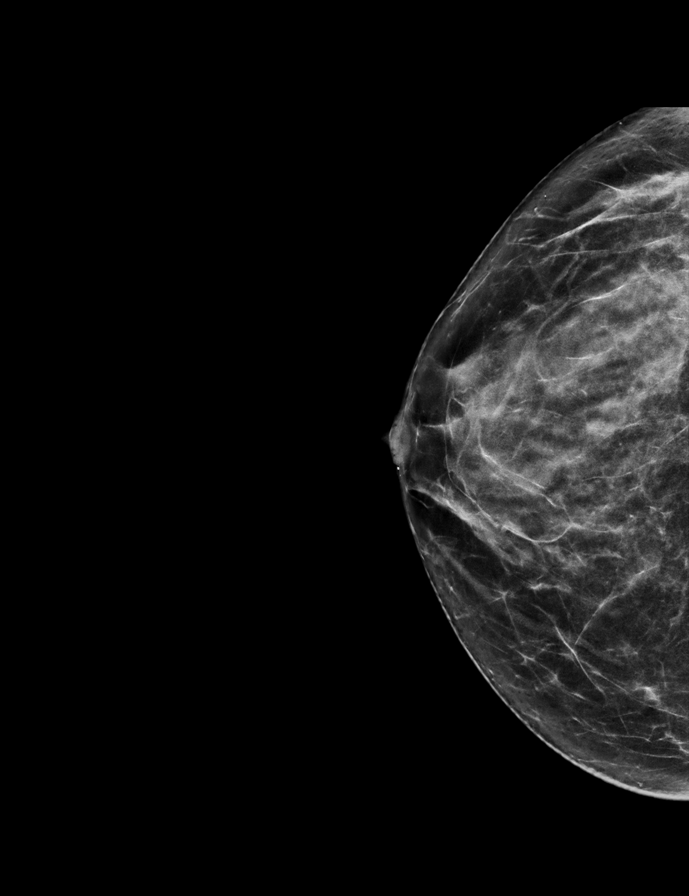

[L CC synth-2D]
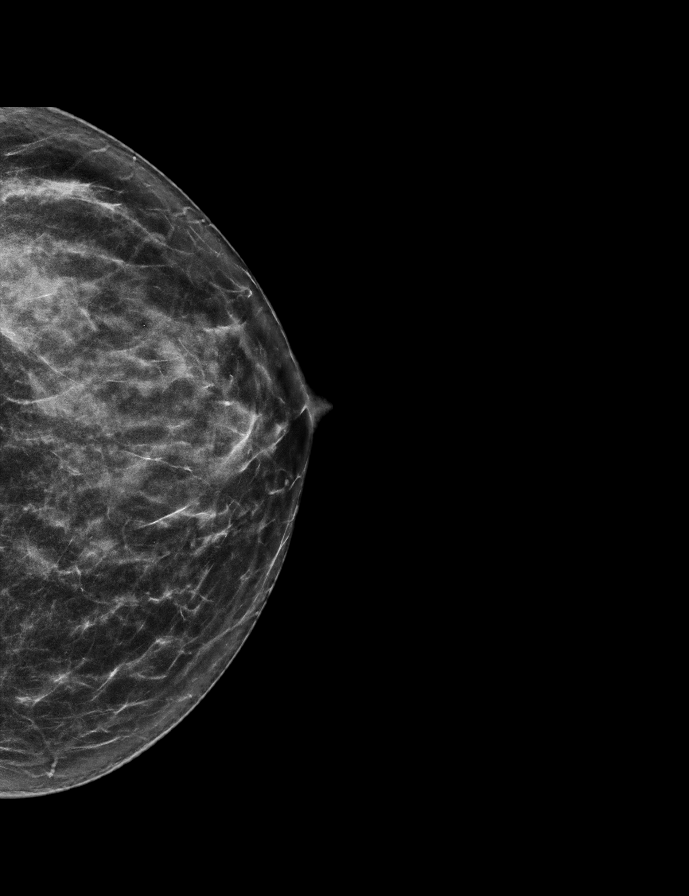

[R CC tomo · 2 of 62 frames shown]
[frame 21/62]
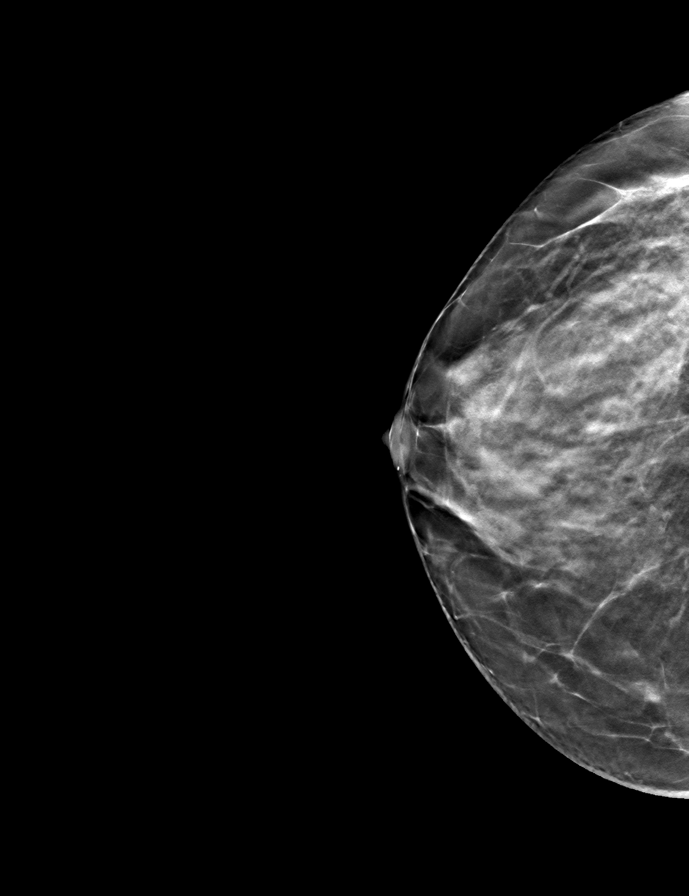
[frame 31/62]
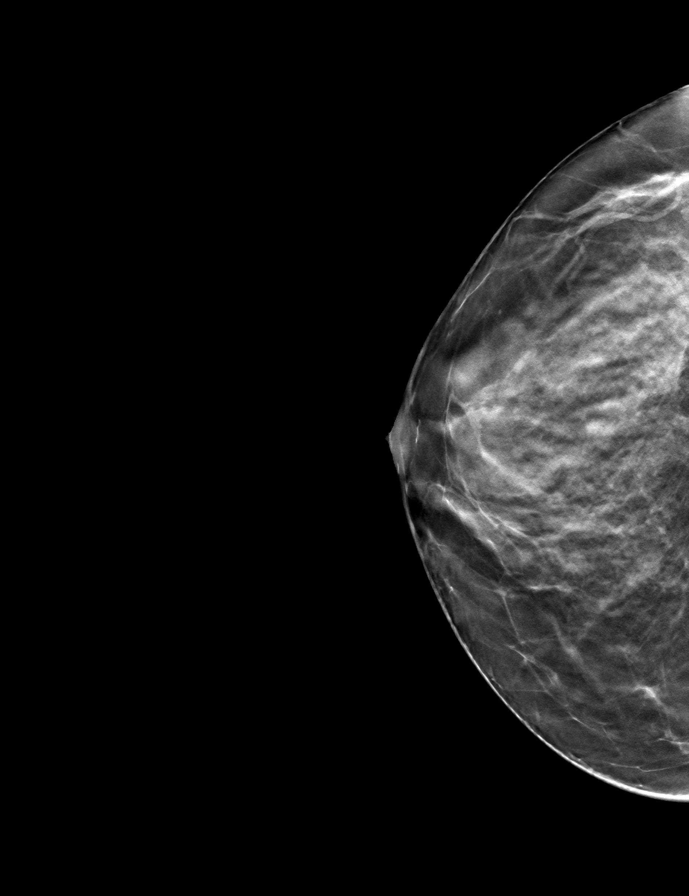

[L CC tomo · tomo slice 30/59.0]
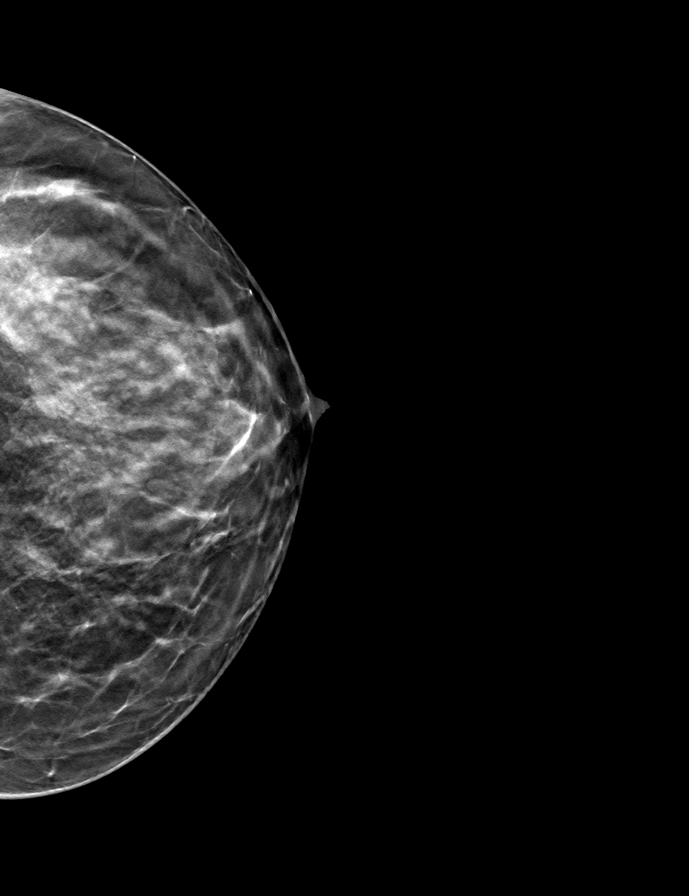

[L MLO tomo · tomo slice 31/62.0]
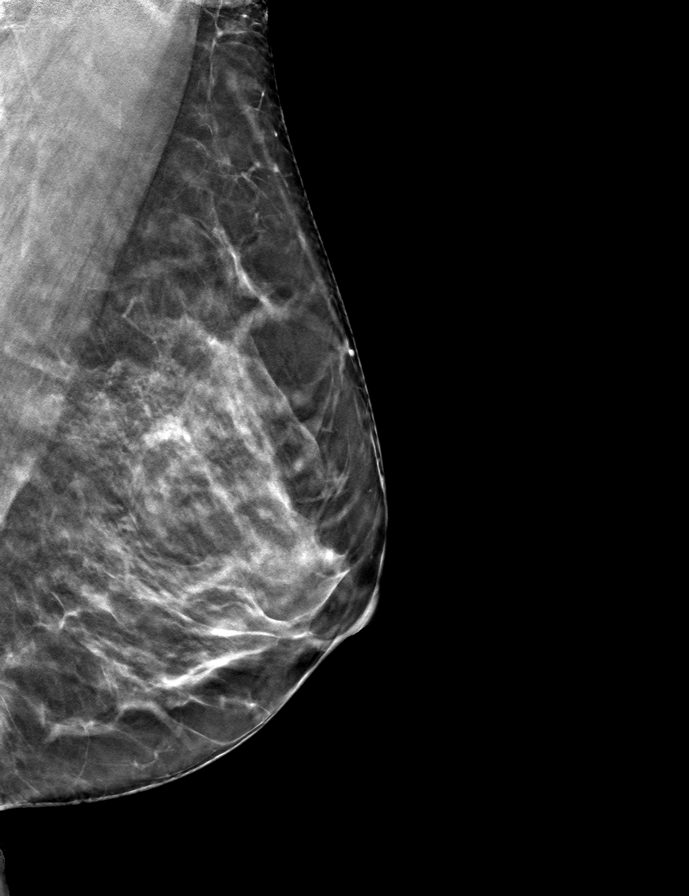

[R MLO tomo · tomo slice 33/64.0]
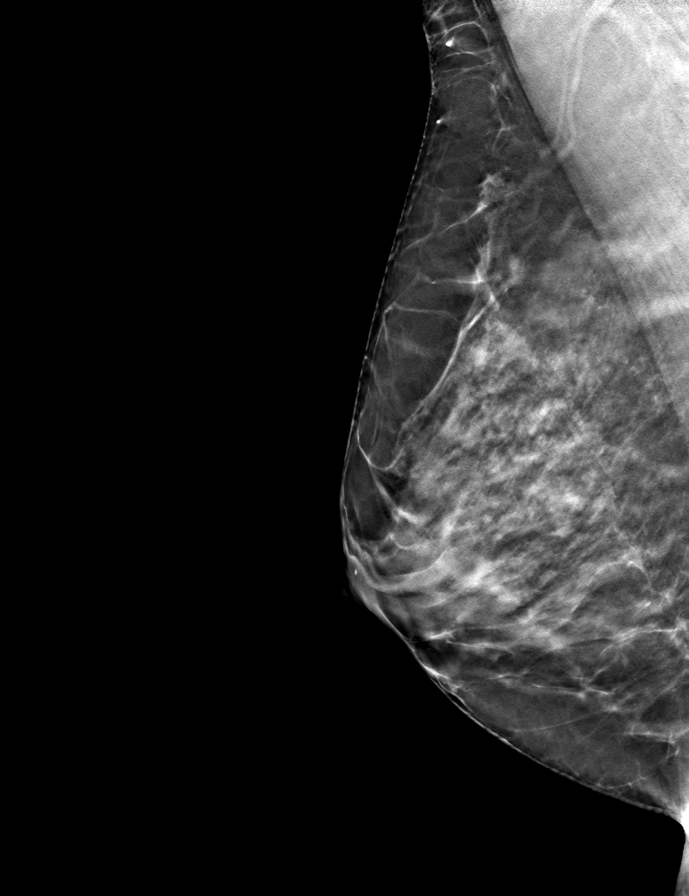

[9 of 24 positions shown; findings below may reference images not displayed]

ACR Breast Density Category c: The breast tissue is heterogeneously
dense, which may obscure small masses.
FINDINGS: There are no findings suspicious for malignancy. The images were
evaluated with computer-aided detection.
IMPRESSION: No mammographic evidence of malignancy. A result letter of this
screening mammogram will be mailed directly to the patient.

RECOMMENDATION:
Screening mammogram in one year. (Code:T4-5-GWO)

BI-RADS CATEGORY  1: Negative.

## 2021-08-31 ENCOUNTER — Other Ambulatory Visit: Payer: Self-pay | Admitting: Obstetrics and Gynecology

## 2021-08-31 DIAGNOSIS — Z1231 Encounter for screening mammogram for malignant neoplasm of breast: Secondary | ICD-10-CM

## 2021-09-10 DIAGNOSIS — Z713 Dietary counseling and surveillance: Secondary | ICD-10-CM | POA: Diagnosis not present

## 2021-09-17 DIAGNOSIS — Z713 Dietary counseling and surveillance: Secondary | ICD-10-CM | POA: Diagnosis not present

## 2021-09-23 DIAGNOSIS — Z713 Dietary counseling and surveillance: Secondary | ICD-10-CM | POA: Diagnosis not present

## 2021-09-24 DIAGNOSIS — Z131 Encounter for screening for diabetes mellitus: Secondary | ICD-10-CM | POA: Diagnosis not present

## 2021-09-24 DIAGNOSIS — Z0000 Encounter for general adult medical examination without abnormal findings: Secondary | ICD-10-CM | POA: Diagnosis not present

## 2021-09-24 DIAGNOSIS — E559 Vitamin D deficiency, unspecified: Secondary | ICD-10-CM | POA: Diagnosis not present

## 2021-09-24 DIAGNOSIS — Z13220 Encounter for screening for lipoid disorders: Secondary | ICD-10-CM | POA: Diagnosis not present

## 2021-10-15 DIAGNOSIS — R110 Nausea: Secondary | ICD-10-CM | POA: Diagnosis not present

## 2021-10-15 DIAGNOSIS — Z713 Dietary counseling and surveillance: Secondary | ICD-10-CM | POA: Diagnosis not present

## 2021-10-25 DIAGNOSIS — Z6828 Body mass index (BMI) 28.0-28.9, adult: Secondary | ICD-10-CM | POA: Diagnosis not present

## 2021-10-25 DIAGNOSIS — Z124 Encounter for screening for malignant neoplasm of cervix: Secondary | ICD-10-CM | POA: Diagnosis not present

## 2021-10-25 DIAGNOSIS — Z01419 Encounter for gynecological examination (general) (routine) without abnormal findings: Secondary | ICD-10-CM | POA: Diagnosis not present

## 2021-10-26 LAB — HM PAP SMEAR

## 2021-11-04 ENCOUNTER — Other Ambulatory Visit: Payer: Self-pay

## 2021-11-04 ENCOUNTER — Ambulatory Visit
Admission: RE | Admit: 2021-11-04 | Discharge: 2021-11-04 | Disposition: A | Discharge status: Home / Self Care | Payer: BC Managed Care – PPO | Source: Ambulatory Visit | Attending: Obstetrics and Gynecology | Admitting: Obstetrics and Gynecology

## 2021-11-04 DIAGNOSIS — Z1231 Encounter for screening mammogram for malignant neoplasm of breast: Secondary | ICD-10-CM

## 2021-11-06 DIAGNOSIS — H6983 Other specified disorders of Eustachian tube, bilateral: Secondary | ICD-10-CM | POA: Diagnosis not present

## 2021-11-06 DIAGNOSIS — H6121 Impacted cerumen, right ear: Secondary | ICD-10-CM | POA: Diagnosis not present

## 2021-11-19 DIAGNOSIS — Z713 Dietary counseling and surveillance: Secondary | ICD-10-CM | POA: Diagnosis not present

## 2021-11-24 ENCOUNTER — Other Ambulatory Visit: Payer: Self-pay | Admitting: Nurse Practitioner

## 2021-11-24 DIAGNOSIS — N644 Mastodynia: Secondary | ICD-10-CM

## 2021-11-26 DIAGNOSIS — M25562 Pain in left knee: Secondary | ICD-10-CM | POA: Diagnosis not present

## 2021-12-20 ENCOUNTER — Other Ambulatory Visit: Payer: BC Managed Care – PPO

## 2021-12-21 ENCOUNTER — Ambulatory Visit: Payer: BC Managed Care – PPO | Admitting: Dermatology

## 2021-12-21 DIAGNOSIS — D485 Neoplasm of uncertain behavior of skin: Secondary | ICD-10-CM

## 2021-12-21 DIAGNOSIS — Z1283 Encounter for screening for malignant neoplasm of skin: Secondary | ICD-10-CM | POA: Diagnosis not present

## 2021-12-21 DIAGNOSIS — L821 Other seborrheic keratosis: Secondary | ICD-10-CM

## 2021-12-21 DIAGNOSIS — L719 Rosacea, unspecified: Secondary | ICD-10-CM

## 2021-12-21 DIAGNOSIS — D1801 Hemangioma of skin and subcutaneous tissue: Secondary | ICD-10-CM | POA: Diagnosis not present

## 2021-12-21 DIAGNOSIS — L820 Inflamed seborrheic keratosis: Secondary | ICD-10-CM

## 2021-12-21 DIAGNOSIS — Z808 Family history of malignant neoplasm of other organs or systems: Secondary | ICD-10-CM

## 2021-12-21 DIAGNOSIS — L738 Other specified follicular disorders: Secondary | ICD-10-CM

## 2021-12-21 MED ORDER — IVERMECTIN 1 % EX CREA: 1.0000 "application " | TOPICAL_CREAM | Freq: Every evening | CUTANEOUS | 5 refills | Status: DC

## 2021-12-21 NOTE — Patient Instructions (Signed)

## 2021-12-27 DIAGNOSIS — H43811 Vitreous degeneration, right eye: Secondary | ICD-10-CM | POA: Diagnosis not present

## 2021-12-28 DIAGNOSIS — F3181 Bipolar II disorder: Secondary | ICD-10-CM | POA: Diagnosis not present

## 2022-01-08 ENCOUNTER — Encounter: Payer: Self-pay | Admitting: Dermatology

## 2022-01-08 NOTE — Progress Notes (Signed)
? ?  Follow-Up Visit ?  ?Subjective  ?[name] [name] is a 51 y.o. female who presents for the following: Annual Exam (Patient here today for yearly skin check. Per patient she has a lesion on her right anterior shoulder x 6 months per patient it does bleed at time. Patient also states that she's noticed that her chest gets red for the last few months no itching no household change. No personal history of atypical moles, melanoma or non mole skin cancer. Family history of atypical moles (mom). ). ? ?Lesion right front shoulder has grown and bled, general skin check, rash on face ?Location:  ?Duration:  ?Quality:  ?Associated Signs/Symptoms: ?Modifying Factors:  ?Severity:  ?Timing: ?Context:  ? ?Objective  ?Well appearing patient in no apparent distress; mood and affect are within normal limits. ?Full body exam: No atypical pigmented lesions (all checked with dermoscopy).  1 possible nonmelanoma skin cancer right front shoulder will be biopsied. ? ?Right collar bone ?Pearly 4 mm papule, dermoscopy: I SK versus SCCA. ? ? ? ? ? ? ?Left Breast, Mid Back, Right Thigh - Posterior ?Noninflamed tan flattopped 4 to 6 mm papules ? ?Left Abdomen (side) - Upper ?Multiple 1 mm smooth red dermal papule ? ?Chest - Medial [facility]), Head - Anterior (Face) ?Central facial/forehead erythema with inflammatory papules ? ?Head - Anterior (Face) ?Several 2 mm flesh-colored papules with eccentric dell, compatible dermoscopy ? ? ? ?A full examination was performed including scalp, head, eyes, ears, nose, lips, neck, chest, axillae, abdomen, back, buttocks, bilateral upper extremities, bilateral lower extremities, hands, feet, fingers, toes, fingernails, and toenails. All findings within normal limits unless otherwise noted below.  Areas beneath undergarments not fully examined. ? ? ?Assessment & Plan  ? ? ?Encounter for screening for malignant neoplasm of skin ? ?Annual skin examination, encouraged to self examine twice annually.  Continued  ultraviolet protection. ? ?Neoplasm of uncertain behavior of skin ?Right collar bone ? ?Skin / nail biopsy ?Type of biopsy: tangential   ?Informed consent: discussed and consent obtained   ?Timeout: patient name, date of birth, surgical site, and procedure verified   ?Anesthesia: the lesion was anesthetized in a standard fashion   ?Anesthetic:  1% lidocaine w/ epinephrine 1-100,000 local infiltration ?Instrument used: flexible razor blade   ?Hemostasis achieved with: aluminum chloride and electrodesiccation   ?Outcome: patient tolerated procedure well   ?Post-procedure details: wound care instructions given   ? ?Specimen 1 - Surgical pathology ?Differential Diagnosis: r/o squamous papilloma- cautery only ? ?Check Margins: No ? ?Seborrheic keratosis (3) ?Right Thigh - Posterior; Left Breast; Mid Back ? ?Leave if stable ? ?Cherry angioma ?Left Abdomen (side) - Upper ? ?No intervention necessary ? ?Rosacea ?Head - Anterior (Face); Chest - Medial [facility]) ? ?Soolantracream nightly for 1 to 2 months, taper if improves.  To contact the office if out-of-pocket cost is too high ? ?Ivermectin (SOOLANTRA) 1 % CREA - Chest - Medial [facility]), Head - Anterior (Face) ?Apply 1 application. topically at bedtime. ? ?Sebaceous gland hyperplasia ?Head - Anterior (Face) ? ?Patient told of similar appearance of early [facility] so if any lesion grows or bleeds return for biopsy ? ? ? ? ? ?I, Lavonna Monarch, MD, have reviewed all documentation for this visit.  The documentation on 01/08/22 for the exam, diagnosis, procedures, and orders are all accurate and complete. ?

## 2022-01-17 ENCOUNTER — Telehealth: Payer: Self-pay | Admitting: Dermatology

## 2022-01-17 NOTE — Telephone Encounter (Signed)
Path to patient isk

## 2022-01-17 NOTE — Telephone Encounter (Signed)
Patient left message on office voice mail that she was calling for pathology results from last visit with Lavonna Monarch, M.D.

## 2022-01-18 DIAGNOSIS — M84374A Stress fracture, right foot, initial encounter for fracture: Secondary | ICD-10-CM | POA: Diagnosis not present

## 2022-01-27 DIAGNOSIS — M79671 Pain in right foot: Secondary | ICD-10-CM | POA: Diagnosis not present

## 2022-02-04 DIAGNOSIS — M79671 Pain in right foot: Secondary | ICD-10-CM | POA: Diagnosis not present

## 2022-03-22 DIAGNOSIS — R509 Fever, unspecified: Secondary | ICD-10-CM | POA: Diagnosis not present

## 2022-03-22 DIAGNOSIS — R5383 Other fatigue: Secondary | ICD-10-CM | POA: Diagnosis not present

## 2022-03-22 DIAGNOSIS — M791 Myalgia, unspecified site: Secondary | ICD-10-CM | POA: Diagnosis not present

## 2022-04-05 DIAGNOSIS — M7061 Trochanteric bursitis, right hip: Secondary | ICD-10-CM | POA: Diagnosis not present

## 2022-04-05 DIAGNOSIS — M7062 Trochanteric bursitis, left hip: Secondary | ICD-10-CM | POA: Diagnosis not present

## 2022-04-13 DIAGNOSIS — M6281 Muscle weakness (generalized): Secondary | ICD-10-CM | POA: Diagnosis not present

## 2022-04-13 DIAGNOSIS — M7061 Trochanteric bursitis, right hip: Secondary | ICD-10-CM | POA: Diagnosis not present

## 2022-04-13 DIAGNOSIS — M7631 Iliotibial band syndrome, right leg: Secondary | ICD-10-CM | POA: Diagnosis not present

## 2022-04-13 DIAGNOSIS — M7062 Trochanteric bursitis, left hip: Secondary | ICD-10-CM | POA: Diagnosis not present

## 2022-04-18 DIAGNOSIS — M7062 Trochanteric bursitis, left hip: Secondary | ICD-10-CM | POA: Diagnosis not present

## 2022-04-18 DIAGNOSIS — M7631 Iliotibial band syndrome, right leg: Secondary | ICD-10-CM | POA: Diagnosis not present

## 2022-04-18 DIAGNOSIS — M7061 Trochanteric bursitis, right hip: Secondary | ICD-10-CM | POA: Diagnosis not present

## 2022-04-18 DIAGNOSIS — M6281 Muscle weakness (generalized): Secondary | ICD-10-CM | POA: Diagnosis not present

## 2022-04-22 DIAGNOSIS — M7061 Trochanteric bursitis, right hip: Secondary | ICD-10-CM | POA: Diagnosis not present

## 2022-04-22 DIAGNOSIS — M6281 Muscle weakness (generalized): Secondary | ICD-10-CM | POA: Diagnosis not present

## 2022-04-22 DIAGNOSIS — M7062 Trochanteric bursitis, left hip: Secondary | ICD-10-CM | POA: Diagnosis not present

## 2022-04-22 DIAGNOSIS — M7631 Iliotibial band syndrome, right leg: Secondary | ICD-10-CM | POA: Diagnosis not present

## 2022-04-27 DIAGNOSIS — M7061 Trochanteric bursitis, right hip: Secondary | ICD-10-CM | POA: Diagnosis not present

## 2022-04-27 DIAGNOSIS — M7631 Iliotibial band syndrome, right leg: Secondary | ICD-10-CM | POA: Diagnosis not present

## 2022-04-27 DIAGNOSIS — M6281 Muscle weakness (generalized): Secondary | ICD-10-CM | POA: Diagnosis not present

## 2022-04-27 DIAGNOSIS — M7062 Trochanteric bursitis, left hip: Secondary | ICD-10-CM | POA: Diagnosis not present

## 2022-05-03 DIAGNOSIS — M7061 Trochanteric bursitis, right hip: Secondary | ICD-10-CM | POA: Diagnosis not present

## 2022-05-03 DIAGNOSIS — M6281 Muscle weakness (generalized): Secondary | ICD-10-CM | POA: Diagnosis not present

## 2022-05-03 DIAGNOSIS — M7062 Trochanteric bursitis, left hip: Secondary | ICD-10-CM | POA: Diagnosis not present

## 2022-05-03 DIAGNOSIS — M7631 Iliotibial band syndrome, right leg: Secondary | ICD-10-CM | POA: Diagnosis not present

## 2022-05-06 DIAGNOSIS — M7062 Trochanteric bursitis, left hip: Secondary | ICD-10-CM | POA: Diagnosis not present

## 2022-05-06 DIAGNOSIS — M7061 Trochanteric bursitis, right hip: Secondary | ICD-10-CM | POA: Diagnosis not present

## 2022-05-06 DIAGNOSIS — M6281 Muscle weakness (generalized): Secondary | ICD-10-CM | POA: Diagnosis not present

## 2022-05-06 DIAGNOSIS — M7631 Iliotibial band syndrome, right leg: Secondary | ICD-10-CM | POA: Diagnosis not present

## 2022-05-11 DIAGNOSIS — M7062 Trochanteric bursitis, left hip: Secondary | ICD-10-CM | POA: Diagnosis not present

## 2022-05-11 DIAGNOSIS — M7061 Trochanteric bursitis, right hip: Secondary | ICD-10-CM | POA: Diagnosis not present

## 2022-05-11 DIAGNOSIS — M6281 Muscle weakness (generalized): Secondary | ICD-10-CM | POA: Diagnosis not present

## 2022-05-11 DIAGNOSIS — M7631 Iliotibial band syndrome, right leg: Secondary | ICD-10-CM | POA: Diagnosis not present

## 2022-05-18 DIAGNOSIS — M7062 Trochanteric bursitis, left hip: Secondary | ICD-10-CM | POA: Diagnosis not present

## 2022-05-18 DIAGNOSIS — M6281 Muscle weakness (generalized): Secondary | ICD-10-CM | POA: Diagnosis not present

## 2022-05-18 DIAGNOSIS — M7061 Trochanteric bursitis, right hip: Secondary | ICD-10-CM | POA: Diagnosis not present

## 2022-05-18 DIAGNOSIS — M7631 Iliotibial band syndrome, right leg: Secondary | ICD-10-CM | POA: Diagnosis not present

## 2022-06-16 DIAGNOSIS — N959 Unspecified menopausal and perimenopausal disorder: Secondary | ICD-10-CM | POA: Diagnosis not present

## 2022-06-16 DIAGNOSIS — N926 Irregular menstruation, unspecified: Secondary | ICD-10-CM | POA: Diagnosis not present

## 2022-06-23 DIAGNOSIS — F3181 Bipolar II disorder: Secondary | ICD-10-CM | POA: Diagnosis not present

## 2022-08-17 DIAGNOSIS — J189 Pneumonia, unspecified organism: Secondary | ICD-10-CM | POA: Diagnosis not present

## 2022-08-17 DIAGNOSIS — J018 Other acute sinusitis: Secondary | ICD-10-CM | POA: Diagnosis not present

## 2022-08-19 ENCOUNTER — Other Ambulatory Visit: Payer: Self-pay | Admitting: Obstetrics and Gynecology

## 2022-08-19 DIAGNOSIS — Z1231 Encounter for screening mammogram for malignant neoplasm of breast: Secondary | ICD-10-CM

## 2022-08-19 DIAGNOSIS — J069 Acute upper respiratory infection, unspecified: Secondary | ICD-10-CM | POA: Diagnosis not present

## 2022-08-26 DIAGNOSIS — J3489 Other specified disorders of nose and nasal sinuses: Secondary | ICD-10-CM | POA: Diagnosis not present

## 2022-08-26 DIAGNOSIS — H6981 Other specified disorders of Eustachian tube, right ear: Secondary | ICD-10-CM | POA: Diagnosis not present

## 2022-09-26 DIAGNOSIS — Z131 Encounter for screening for diabetes mellitus: Secondary | ICD-10-CM | POA: Diagnosis not present

## 2022-09-26 DIAGNOSIS — E559 Vitamin D deficiency, unspecified: Secondary | ICD-10-CM | POA: Diagnosis not present

## 2022-09-26 DIAGNOSIS — Z0000 Encounter for general adult medical examination without abnormal findings: Secondary | ICD-10-CM | POA: Diagnosis not present

## 2022-09-26 DIAGNOSIS — M79674 Pain in right toe(s): Secondary | ICD-10-CM | POA: Diagnosis not present

## 2022-09-26 DIAGNOSIS — Z13220 Encounter for screening for lipoid disorders: Secondary | ICD-10-CM | POA: Diagnosis not present

## 2022-10-12 ENCOUNTER — Ambulatory Visit: Payer: BC Managed Care – PPO

## 2022-10-26 DIAGNOSIS — Z6825 Body mass index (BMI) 25.0-25.9, adult: Secondary | ICD-10-CM | POA: Diagnosis not present

## 2022-10-26 DIAGNOSIS — Z01419 Encounter for gynecological examination (general) (routine) without abnormal findings: Secondary | ICD-10-CM | POA: Diagnosis not present

## 2022-10-26 DIAGNOSIS — R300 Dysuria: Secondary | ICD-10-CM | POA: Diagnosis not present

## 2022-10-28 DIAGNOSIS — Z23 Encounter for immunization: Secondary | ICD-10-CM | POA: Diagnosis not present

## 2022-11-03 DIAGNOSIS — L720 Epidermal cyst: Secondary | ICD-10-CM | POA: Diagnosis not present

## 2022-11-03 DIAGNOSIS — L821 Other seborrheic keratosis: Secondary | ICD-10-CM | POA: Diagnosis not present

## 2022-11-11 ENCOUNTER — Ambulatory Visit
Admission: RE | Admit: 2022-11-11 | Discharge: 2022-11-11 | Disposition: A | Discharge status: Home / Self Care | Payer: BC Managed Care – PPO | Source: Ambulatory Visit | Attending: Obstetrics and Gynecology | Admitting: Obstetrics and Gynecology

## 2022-11-11 DIAGNOSIS — Z1231 Encounter for screening mammogram for malignant neoplasm of breast: Secondary | ICD-10-CM

## 2022-11-14 DIAGNOSIS — N821 Other female urinary-genital tract fistulae: Secondary | ICD-10-CM | POA: Diagnosis not present

## 2022-11-14 DIAGNOSIS — R300 Dysuria: Secondary | ICD-10-CM | POA: Diagnosis not present

## 2022-11-15 ENCOUNTER — Other Ambulatory Visit: Payer: Self-pay | Admitting: Obstetrics and Gynecology

## 2022-11-15 DIAGNOSIS — R928 Other abnormal and inconclusive findings on diagnostic imaging of breast: Secondary | ICD-10-CM

## 2022-11-24 ENCOUNTER — Ambulatory Visit
Admission: RE | Admit: 2022-11-24 | Discharge: 2022-11-24 | Disposition: A | Discharge status: Home / Self Care | Payer: BC Managed Care – PPO | Source: Ambulatory Visit | Attending: Obstetrics and Gynecology | Admitting: Obstetrics and Gynecology

## 2022-11-24 ENCOUNTER — Other Ambulatory Visit: Payer: Self-pay | Admitting: Obstetrics and Gynecology

## 2022-11-24 DIAGNOSIS — R922 Inconclusive mammogram: Secondary | ICD-10-CM | POA: Diagnosis not present

## 2022-11-24 DIAGNOSIS — N6323 Unspecified lump in the left breast, lower outer quadrant: Secondary | ICD-10-CM | POA: Diagnosis not present

## 2022-11-24 DIAGNOSIS — R928 Other abnormal and inconclusive findings on diagnostic imaging of breast: Secondary | ICD-10-CM

## 2022-11-24 DIAGNOSIS — N6320 Unspecified lump in the left breast, unspecified quadrant: Secondary | ICD-10-CM

## 2022-12-15 DIAGNOSIS — F3181 Bipolar II disorder: Secondary | ICD-10-CM | POA: Diagnosis not present

## 2022-12-26 ENCOUNTER — Ambulatory Visit: Payer: BC Managed Care – PPO | Admitting: Dermatology

## 2022-12-30 DIAGNOSIS — Z23 Encounter for immunization: Secondary | ICD-10-CM | POA: Diagnosis not present

## 2023-02-03 ENCOUNTER — Encounter (HOSPITAL_BASED_OUTPATIENT_CLINIC_OR_DEPARTMENT_OTHER): Payer: Self-pay | Admitting: Emergency Medicine

## 2023-02-03 ENCOUNTER — Emergency Department (HOSPITAL_BASED_OUTPATIENT_CLINIC_OR_DEPARTMENT_OTHER)
Admission: EM | Admit: 2023-02-03 | Discharge: 2023-02-03 | Disposition: A | Discharge status: Home / Self Care | Payer: BC Managed Care – PPO | Attending: Emergency Medicine | Admitting: Emergency Medicine

## 2023-02-03 ENCOUNTER — Other Ambulatory Visit: Payer: Self-pay

## 2023-02-03 ENCOUNTER — Emergency Department (HOSPITAL_BASED_OUTPATIENT_CLINIC_OR_DEPARTMENT_OTHER): Payer: BC Managed Care – PPO

## 2023-02-03 DIAGNOSIS — M79604 Pain in right leg: Secondary | ICD-10-CM | POA: Insufficient documentation

## 2023-02-03 DIAGNOSIS — R5383 Other fatigue: Secondary | ICD-10-CM | POA: Insufficient documentation

## 2023-02-03 DIAGNOSIS — M25511 Pain in right shoulder: Secondary | ICD-10-CM | POA: Insufficient documentation

## 2023-02-03 DIAGNOSIS — R0602 Shortness of breath: Secondary | ICD-10-CM | POA: Insufficient documentation

## 2023-02-03 DIAGNOSIS — M79605 Pain in left leg: Secondary | ICD-10-CM | POA: Diagnosis not present

## 2023-02-03 DIAGNOSIS — M549 Dorsalgia, unspecified: Secondary | ICD-10-CM | POA: Insufficient documentation

## 2023-02-03 LAB — CBC WITH DIFFERENTIAL/PLATELET
Abs Immature Granulocytes: 0.02 10*3/uL (ref 0.00–0.07)
Basophils Absolute: 0.1 10*3/uL (ref 0.0–0.1)
Basophils Relative: 1 %
Eosinophils Absolute: 0.1 10*3/uL (ref 0.0–0.5)
Eosinophils Relative: 1 %
HCT: 37.7 % (ref 36.0–46.0)
Hemoglobin: 12.9 g/dL (ref 12.0–15.0)
Immature Granulocytes: 0 %
Lymphocytes Relative: 27 %
Lymphs Abs: 2.3 10*3/uL (ref 0.7–4.0)
MCH: 31.9 pg (ref 26.0–34.0)
MCHC: 34.2 g/dL (ref 30.0–36.0)
MCV: 93.3 fL (ref 80.0–100.0)
Monocytes Absolute: 0.5 10*3/uL (ref 0.1–1.0)
Monocytes Relative: 6 %
Neutro Abs: 5.6 10*3/uL (ref 1.7–7.7)
Neutrophils Relative %: 65 %
Platelets: 241 10*3/uL (ref 150–400)
RBC: 4.04 MIL/uL (ref 3.87–5.11)
RDW: 11.9 % (ref 11.5–15.5)
WBC: 8.5 10*3/uL (ref 4.0–10.5)
nRBC: 0 % (ref 0.0–0.2)

## 2023-02-03 LAB — BASIC METABOLIC PANEL
Anion gap: 8 (ref 5–15)
BUN: 10 mg/dL (ref 6–20)
CO2: 26 mmol/L (ref 22–32)
Calcium: 9 mg/dL (ref 8.9–10.3)
Chloride: 104 mmol/L (ref 98–111)
Creatinine, Ser: 0.81 mg/dL (ref 0.44–1.00)
GFR, Estimated: >60 mL/min (ref >60)
Glucose, Bld: 115 mg/dL — ABNORMAL HIGH (ref 70–99)
Potassium: 4 mmol/L (ref 3.5–5.1)
Sodium: 138 mmol/L (ref 135–145)

## 2023-02-03 MED ORDER — IOHEXOL 350 MG/ML SOLN
100.0000 mL | Freq: Once | INTRAVENOUS | Status: AC | PRN
  Administered 2023-02-03: 80 mL via INTRAVENOUS

## 2023-02-03 NOTE — ED Provider Notes (Signed)
[place] EMERGENCY DEPARTMENT AT [facility] Provider Note   CSN: [id number] Arrival date & time: 02/03/23  1518     History  Chief Complaint  Patient presents with   Shortness of Breath    [name] [name] is a 52 y.o. female.  Patient with history of estrogen use presents to the emergency department for evaluation of generalized fatigue, shortness of breath worse with activity, and right back and shoulder pain.  Her fatigue started about 4 days ago.  Patient walks frequently and noted that she was getting more short of breath with activity.  She developed some shoulder pain on the right side in the past day or so.  This morning she continued to feel short of breath so she went and saw her primary care provider.  Due to her concurrent estrogen use, she was referred to the emergency department to be evaluated for pulmonary embolism.  Patient does have some pain in her legs due to exercise which was maybe a little worse over the past week and more anterior in her thighs, but bilateral. Patient denies risk factors for pulmonary embolism including: unilateral leg swelling, history of DVT/PE/other blood clots, recent immobilizations, recent surgery, recent travel (>4hr segment), malignancy, hemoptysis.  She had pneumonia several months ago however this feels different.  She denies active fever or cough.  She does state that with taking a deep breath, the pain in her back and shoulder feel worse.         Home Medications Prior to Admission medications   Medication Sig Start Date End Date Taking? Authorizing Provider  Ivermectin (SOOLANTRA) 1 % CREA Apply 1 application. topically at bedtime. 12/21/21   Janalyn Harder, MD  lamoTRIgine (LAMICTAL) 100 MG tablet lamotrigine 100 mg tablet  TAKE 1 TABLET BY MOUTH EVERY MORNING AND 1 AND 1/2 TABLETS AT BEDTIME 09/19/16   [provider]  nystatin-triamcinolone (MYCOLOG II) cream APPLY TO AFFECTED AREA topically 2 TIMES DAILY IN THE  MORNING AND IN THE EVENING 11/05/19   [provider]      Allergies    Patient has no known allergies.    Review of Systems   Review of Systems  Physical Exam Updated Vital Signs BP 130/76   Pulse 81   Temp 98.1 F (36.7 C)   Resp 19   Ht 5\' 8"  (1.727 m)   Wt 74.4 kg   LMP 01/27/2023   SpO2 98%   BMI 24.94 kg/m   Physical Exam Vitals and nursing note reviewed.  Constitutional:      Appearance: She is well-developed. She is not diaphoretic.  HENT:     Head: Normocephalic and atraumatic.     Mouth/Throat:     Mouth: Mucous membranes are not dry.  Eyes:     Conjunctiva/sclera: Conjunctivae normal.  Neck:     Vascular: Normal carotid pulses. No JVD.     Trachea: Trachea normal. No tracheal deviation.  Cardiovascular:     Rate and Rhythm: Normal rate and regular rhythm.     Pulses: No decreased pulses.          Radial pulses are 2+ on the right side and 2+ on the left side.     Heart sounds: Normal heart sounds, S1 normal and S2 normal. No murmur heard. Pulmonary:     Effort: Pulmonary effort is normal. No respiratory distress.     Breath sounds: No wheezing.  Chest:     Chest wall: No tenderness.  Abdominal:  General: Bowel sounds are normal.     Palpations: Abdomen is soft.     Tenderness: There is no abdominal tenderness. There is no guarding or rebound.  Musculoskeletal:        General: Normal range of motion.     Cervical back: Normal range of motion and neck supple. No muscular tenderness.  Skin:    General: Skin is warm and dry.     Coloration: Skin is not pale.  Neurological:     Mental Status: She is alert.     ED Results / Procedures / Treatments   Labs (all labs ordered are listed, but only abnormal results are displayed) Labs Reviewed  BASIC METABOLIC PANEL - Abnormal; Notable for the following components:      Result Value   Glucose, Bld 115 (*)    All other components within normal limits  CBC WITH DIFFERENTIAL/PLATELET     EKG EKG Interpretation  Date/Time:  Friday February 03 2023 15:32:38 EDT Ventricular Rate:  74 PR Interval:  106 QRS Duration: 95 QT Interval:  388 QTC Calculation: 431 R Axis:   80 Text Interpretation: Sinus rhythm Short PR interval no prior Confirmed by Tanda Rockers ([id number]) on 02/03/2023 6:07:12 PM  Radiology CT Angio Chest PE W and/or Wo Contrast  Result Date: 02/03/2023 CLINICAL DATA:  Pulmonary embolism (PE) suspected, high prob Fatigue, shortness of breath and back pain. EXAM: CT ANGIOGRAPHY CHEST WITH CONTRAST TECHNIQUE: Multidetector CT imaging of the chest was performed using the standard protocol during bolus administration of intravenous contrast. Multiplanar CT image reconstructions and MIPs were obtained to evaluate the vascular anatomy. RADIATION DOSE REDUCTION: This exam was performed according to the departmental dose-optimization program which includes automated exposure control, adjustment of the mA and/or kV according to patient size and/or use of iterative reconstruction technique. CONTRAST:  80mL OMNIPAQUE IOHEXOL 350 MG/ML SOLN COMPARISON:  None Available. FINDINGS: Cardiovascular: There are no filling defects within the pulmonary arteries to suggest pulmonary embolus. The thoracic aorta is normal in caliber without dissection or acute findings. Normal heart size. No pericardial effusion. Mediastinum/Nodes: No enlarged mediastinal, hilar, or axillary lymph nodes. No esophageal wall thickening. No thyroid nodule. Lungs/Pleura: Hypoventilatory changes dependently. No focal airspace disease, pleural fluid or features of pulmonary edema. The trachea and central airways are clear. No pulmonary mass. Upper Abdomen: No acute or unexpected findings. Musculoskeletal: There are no acute or suspicious osseous abnormalities. No chest wall soft tissue abnormalities. Review of the MIP images confirms the above findings. IMPRESSION: No pulmonary embolus or acute intrathoracic abnormality.  Electronically Signed   By: Narda Rutherford M.D.   On: 02/03/2023 17:57    Procedures Procedures    Medications Ordered in ED Medications - No data to display  ED Course/ Medical Decision Making/ A&P    Patient seen and examined. History obtained directly from patient.   Labs/EKG: Ordered CBC, BMP.  Imaging: Ordered CT angio of the chest to evaluate for pulmonary embolism.  Medications/Fluids: None ordered  Most recent vital signs reviewed and are as follows: BP 130/76   Pulse 81   Temp 98.1 F (36.7 C)   Resp 19   Ht 5\' 8"  (1.727 m)   Wt 74.4 kg   LMP 01/27/2023   SpO2 98%   BMI 24.94 kg/m   Initial impression: Rule out PE, shoulder pain, exertional dyspnea  I did discuss with patient options to proceed.  Discussed that we could check a D-dimer given that overall she is low risk with  only risk factor being estrogen use or we can proceed with CT given lack of other obvious causes for her symptoms.  She states that primary care provider had mentioned to scan and she is comfortable with proceeding with this.  CTA ordered.  6:12 PM Reassessment performed. Patient appears stable.  Patient's husband now bedside.  Labs personally reviewed and interpreted including: CBC unremarkable; BMP glucose minimally elevated at 115 otherwise unremarkable  Imaging personally visualized and interpreted including:CTA of the chest agree no PE, pneumonia, edema, other acute findings.  Reviewed pertinent lab work and imaging with patient at bedside. Questions answered.   Most current vital signs reviewed and are as follows: BP 130/76   Pulse 81   Temp 98.1 F (36.7 C)   Resp 19   Ht 5\' 8"  (1.727 m)   Wt 74.4 kg   LMP 01/27/2023   SpO2 98%   BMI 24.94 kg/m   Plan: Discharge to home.   Prescriptions written for: None  Other home care instructions discussed: Rest  ED return instructions discussed: New or worsening symptoms, shortness of breath, severe chest pain,  lightheadedness, syncope.  Follow-up instructions discussed: Patient encouraged to follow-up with their PCP in 3 days.                             Medical Decision Making Amount and/or Complexity of Data Reviewed Labs: ordered. Radiology: ordered.  Risk Prescription drug management.   Patient sent in for PE evaluation today.  Patient has had some exertional dyspnea with shoulder and back pain.  Risk factor for PE includes estrogen use, age.  Fortunately workup in the ED has been very reassuring.  EKG nonischemic with normal rhythm.  Labs unremarkable.  CTA of the chest without PE or other acute findings which would explain her symptoms.  May be musculoskeletal.  Regardless given her breathing symptoms, recommend PCP follow-up early next week.  Low concern for ACS.  No signs of pneumothorax or dissection.  No abdominal symptoms to suggest referred pain from the abdomen.  The patient's vital signs, pertinent lab work and imaging were reviewed and interpreted as discussed in the ED course. Hospitalization was considered for further testing, treatments, or serial exams/observation. However as patient is well-appearing, has a stable exam, and reassuring studies today, I do not feel that they warrant admission at this time. This plan was discussed with the patient who verbalizes agreement and comfort with this plan and seems reliable and able to return to the Emergency Department with worsening or changing symptoms.         Final Clinical Impression(s) / ED Diagnoses Final diagnoses:  Acute pain of right shoulder  Shortness of breath    Rx / DC Orders ED Discharge Orders     None         Renne Crigler, PA-C 02/03/23 1814    Sloan Leiter, DO 02/04/23 0011

## 2023-02-03 NOTE — ED Triage Notes (Signed)
Pt arrives pov, steady gait with c/o back pain today, fatigue x 3-4 days, shob with ambulation. Dizziness yesterday. Denies CP Referred by PCP to r/o PE

## 2023-02-03 NOTE — Discharge Instructions (Signed)
Please read and follow all provided instructions.  Your diagnoses today include:  1. Acute pain of right shoulder   2. Shortness of breath     Tests performed today include: An EKG of your heart: No abnormal heart rhythms CT scan of your chest: Does not show any sign of fluid buildup in the lungs, pneumonia, blood clot or other acute problems Blood counts and electrolytes Vital signs. See below for your results today.   Medications prescribed:  None  Take any prescribed medications only as directed.  Follow-up instructions: Please follow-up with your primary care provider as soon as you can for further evaluation of your symptoms.   Return instructions:  SEEK IMMEDIATE MEDICAL ATTENTION IF: You have severe chest pain, especially if the pain is crushing or pressure-like and spreads to the arms, back, neck, or jaw, or if you have sweating, nausea or vomiting, or trouble with breathing. THIS IS AN EMERGENCY. Do not wait to see if the pain will go away. Get medical help at once. Call 911. DO NOT drive yourself to the hospital.  Your chest pain gets worse and does not go away after a few minutes of rest.  You have an attack of chest pain lasting longer than what you usually experience.  You have significant dizziness, if you pass out, or have trouble walking.  You have chest pain not typical of your usual pain for which you originally saw your caregiver.  You have any other emergent concerns regarding your health.   Your vital signs today were: BP 130/76   Pulse 81   Temp 98.1 F (36.7 C)   Resp 19   Ht 5\' 8"  (1.727 m)   Wt 74.4 kg   LMP 01/27/2023   SpO2 98%   BMI 24.94 kg/m  If your blood pressure (BP) was elevated above 135/85 this visit, please have this repeated by your doctor within one month. --------------

## 2023-02-16 DIAGNOSIS — R0602 Shortness of breath: Secondary | ICD-10-CM | POA: Diagnosis not present

## 2023-02-16 DIAGNOSIS — M546 Pain in thoracic spine: Secondary | ICD-10-CM | POA: Diagnosis not present

## 2023-02-16 DIAGNOSIS — R6889 Other general symptoms and signs: Secondary | ICD-10-CM | POA: Diagnosis not present

## 2023-02-22 DIAGNOSIS — M79605 Pain in left leg: Secondary | ICD-10-CM | POA: Diagnosis not present

## 2023-02-22 DIAGNOSIS — M79606 Pain in leg, unspecified: Secondary | ICD-10-CM | POA: Diagnosis not present

## 2023-03-13 DIAGNOSIS — S2096XA Insect bite (nonvenomous) of unspecified parts of thorax, initial encounter: Secondary | ICD-10-CM | POA: Diagnosis not present

## 2023-04-11 DIAGNOSIS — M62838 Other muscle spasm: Secondary | ICD-10-CM | POA: Diagnosis not present

## 2023-04-13 ENCOUNTER — Ambulatory Visit: Payer: BC Managed Care – PPO | Admitting: Cardiovascular Disease

## 2023-05-10 ENCOUNTER — Encounter (HOSPITAL_COMMUNITY): Payer: Self-pay

## 2023-05-10 ENCOUNTER — Emergency Department (HOSPITAL_COMMUNITY)
Admission: EM | Admit: 2023-05-10 | Discharge: 2023-05-10 | Disposition: A | Discharge status: Home / Self Care | Payer: BC Managed Care – PPO | Attending: Emergency Medicine | Admitting: Emergency Medicine

## 2023-05-10 ENCOUNTER — Emergency Department (HOSPITAL_COMMUNITY): Payer: BC Managed Care – PPO

## 2023-05-10 ENCOUNTER — Other Ambulatory Visit: Payer: Self-pay

## 2023-05-10 DIAGNOSIS — M25531 Pain in right wrist: Secondary | ICD-10-CM | POA: Diagnosis not present

## 2023-05-10 DIAGNOSIS — S59901A Unspecified injury of right elbow, initial encounter: Secondary | ICD-10-CM | POA: Diagnosis not present

## 2023-05-10 DIAGNOSIS — M25521 Pain in right elbow: Secondary | ICD-10-CM | POA: Diagnosis not present

## 2023-05-10 DIAGNOSIS — M25421 Effusion, right elbow: Secondary | ICD-10-CM | POA: Diagnosis not present

## 2023-05-10 DIAGNOSIS — S52021A Displaced fracture of olecranon process without intraarticular extension of right ulna, initial encounter for closed fracture: Secondary | ICD-10-CM | POA: Insufficient documentation

## 2023-05-10 DIAGNOSIS — S6991XA Unspecified injury of right wrist, hand and finger(s), initial encounter: Secondary | ICD-10-CM | POA: Diagnosis not present

## 2023-05-10 MED ORDER — OXYCODONE HCL 5 MG PO TABS: 5.0000 mg | ORAL_TABLET | Freq: Once | ORAL | Status: DC

## 2023-05-10 MED ORDER — OXYCODONE HCL 5 MG PO TABS: 5.0000 mg | ORAL_TABLET | Freq: Four times a day (QID) | ORAL | 0 refills | Status: DC | PRN

## 2023-05-10 MED ORDER — ONDANSETRON HCL 4 MG PO TABS
4.0000 mg | ORAL_TABLET | Freq: Once | ORAL | Status: AC
  Administered 2023-05-10: 4 mg via ORAL
  Filled 2023-05-10: qty 1

## 2023-05-10 MED ORDER — ACETAMINOPHEN 500 MG PO TABS: 1000.0000 mg | ORAL_TABLET | Freq: Once | ORAL | Status: DC

## 2023-05-10 MED ORDER — HYDROCODONE-ACETAMINOPHEN 5-325 MG PO TABS
1.0000 | ORAL_TABLET | Freq: Once | ORAL | Status: AC
  Administered 2023-05-10: 1 via ORAL
  Filled 2023-05-10 (×2): qty 1

## 2023-05-10 MED ORDER — MORPHINE SULFATE (PF) 4 MG/ML IV SOLN
4.0000 mg | Freq: Once | INTRAVENOUS | Status: AC
  Administered 2023-05-10: 4 mg via INTRAVENOUS
  Filled 2023-05-10: qty 1

## 2023-05-10 MED ORDER — OXYCODONE-ACETAMINOPHEN 5-325 MG PO TABS: 1.0000 | ORAL_TABLET | Freq: Four times a day (QID) | ORAL | 0 refills | Status: DC | PRN

## 2023-05-10 NOTE — ED Notes (Signed)
Pt's pain medication was sent to a different [facility] ([facility] - due to being a 24/7 pharmacy). Provider updated.
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
## 2023-05-10 NOTE — Discharge Instructions (Addendum)
Please follow-up with your orthopedic surgeon this week.  You can call and tell them that you have an olecranon fracture.  You can take oxycodone every 6 hours as needed for breakthrough pain.  You can take 1000 mg of Tylenol every 8 hours, 400 mg of ibuprofen every 6 hours.

## 2023-05-10 NOTE — Progress Notes (Signed)
Orthopedic Tech Progress Note Patient Details:  [name] [name] [facility] [birth year] [id number]  ED [facility] applied wound care to patient then I applied long arm splint to patient. Added a little more padding to elbow area   Ortho Devices Type of Ortho Device: Ace wrap, Cotton web roll, Arm sling, Long arm splint Ortho Device/Splint Location: RUE Ortho Device/Splint Interventions: Ordered, Application, Adjustment   Post Interventions Patient Tolerated: Well Instructions Provided: Care of device  [name] 05/10/2023, 9:20 PM

## 2023-05-10 NOTE — ED Notes (Signed)
Ortho called regarding splint. Ortho stated she would be there in a few minutes.

## 2023-05-10 NOTE — ED Notes (Signed)
Pt's elbow and hand bandaged, Ortho present in room for splint. Pt complains of pain to elbow. RN aware

## 2023-05-11 DIAGNOSIS — S0990XA Unspecified injury of head, initial encounter: Secondary | ICD-10-CM | POA: Diagnosis not present

## 2023-05-11 DIAGNOSIS — S52021A Displaced fracture of olecranon process without intraarticular extension of right ulna, initial encounter for closed fracture: Secondary | ICD-10-CM | POA: Diagnosis not present

## 2023-05-17 DIAGNOSIS — Y999 Unspecified external cause status: Secondary | ICD-10-CM | POA: Diagnosis not present

## 2023-05-17 DIAGNOSIS — X58XXXA Exposure to other specified factors, initial encounter: Secondary | ICD-10-CM | POA: Diagnosis not present

## 2023-05-17 DIAGNOSIS — S52031A Displaced fracture of olecranon process with intraarticular extension of right ulna, initial encounter for closed fracture: Secondary | ICD-10-CM | POA: Diagnosis not present

## 2023-05-22 DIAGNOSIS — R509 Fever, unspecified: Secondary | ICD-10-CM | POA: Diagnosis not present

## 2023-05-23 DIAGNOSIS — Z803 Family history of malignant neoplasm of breast: Secondary | ICD-10-CM | POA: Diagnosis not present

## 2023-05-29 ENCOUNTER — Other Ambulatory Visit: Payer: BC Managed Care – PPO

## 2023-05-29 ENCOUNTER — Inpatient Hospital Stay: Admission: RE | Admit: 2023-05-29 | Payer: BC Managed Care – PPO | Source: Ambulatory Visit

## 2023-05-30 HISTORY — PX: BREAST BIOPSY: SHX20

## 2023-06-01 ENCOUNTER — Other Ambulatory Visit: Payer: Self-pay | Admitting: Radiology

## 2023-06-01 ENCOUNTER — Encounter: Payer: Self-pay | Admitting: Radiology

## 2023-06-01 ENCOUNTER — Other Ambulatory Visit: Payer: Self-pay | Admitting: Urology

## 2023-06-01 DIAGNOSIS — Z803 Family history of malignant neoplasm of breast: Secondary | ICD-10-CM

## 2023-06-01 DIAGNOSIS — R109 Unspecified abdominal pain: Secondary | ICD-10-CM

## 2023-06-01 DIAGNOSIS — S52021A Displaced fracture of olecranon process without intraarticular extension of right ulna, initial encounter for closed fracture: Secondary | ICD-10-CM | POA: Diagnosis not present

## 2023-06-02 DIAGNOSIS — M25621 Stiffness of right elbow, not elsewhere classified: Secondary | ICD-10-CM | POA: Diagnosis not present

## 2023-06-02 DIAGNOSIS — S52021D Displaced fracture of olecranon process without intraarticular extension of right ulna, subsequent encounter for closed fracture with routine healing: Secondary | ICD-10-CM | POA: Diagnosis not present

## 2023-06-06 ENCOUNTER — Ambulatory Visit
Admission: RE | Admit: 2023-06-06 | Discharge: 2023-06-06 | Disposition: A | Discharge status: Home / Self Care | Payer: BC Managed Care – PPO | Source: Ambulatory Visit | Attending: Obstetrics and Gynecology | Admitting: Obstetrics and Gynecology

## 2023-06-06 ENCOUNTER — Other Ambulatory Visit: Payer: Self-pay | Admitting: Obstetrics and Gynecology

## 2023-06-06 DIAGNOSIS — N6323 Unspecified lump in the left breast, lower outer quadrant: Secondary | ICD-10-CM | POA: Diagnosis not present

## 2023-06-06 DIAGNOSIS — N6320 Unspecified lump in the left breast, unspecified quadrant: Secondary | ICD-10-CM

## 2023-06-06 DIAGNOSIS — N6002 Solitary cyst of left breast: Secondary | ICD-10-CM | POA: Diagnosis not present

## 2023-06-06 DIAGNOSIS — R928 Other abnormal and inconclusive findings on diagnostic imaging of breast: Secondary | ICD-10-CM

## 2023-06-07 DIAGNOSIS — S52012D Torus fracture of upper end of left ulna, subsequent encounter for fracture with routine healing: Secondary | ICD-10-CM | POA: Diagnosis not present

## 2023-06-07 DIAGNOSIS — M25621 Stiffness of right elbow, not elsewhere classified: Secondary | ICD-10-CM | POA: Diagnosis not present

## 2023-06-13 DIAGNOSIS — F3181 Bipolar II disorder: Secondary | ICD-10-CM | POA: Diagnosis not present

## 2023-06-13 DIAGNOSIS — S52021D Displaced fracture of olecranon process without intraarticular extension of right ulna, subsequent encounter for closed fracture with routine healing: Secondary | ICD-10-CM | POA: Diagnosis not present

## 2023-06-13 DIAGNOSIS — M25621 Stiffness of right elbow, not elsewhere classified: Secondary | ICD-10-CM | POA: Diagnosis not present

## 2023-06-14 DIAGNOSIS — N62 Hypertrophy of breast: Secondary | ICD-10-CM | POA: Diagnosis not present

## 2023-06-14 DIAGNOSIS — R928 Other abnormal and inconclusive findings on diagnostic imaging of breast: Secondary | ICD-10-CM | POA: Diagnosis not present

## 2023-06-14 DIAGNOSIS — N6489 Other specified disorders of breast: Secondary | ICD-10-CM | POA: Diagnosis not present

## 2023-06-14 DIAGNOSIS — N6012 Diffuse cystic mastopathy of left breast: Secondary | ICD-10-CM | POA: Diagnosis not present

## 2023-06-14 DIAGNOSIS — N6032 Fibrosclerosis of left breast: Secondary | ICD-10-CM | POA: Diagnosis not present

## 2023-06-19 DIAGNOSIS — M25621 Stiffness of right elbow, not elsewhere classified: Secondary | ICD-10-CM | POA: Diagnosis not present

## 2023-06-19 DIAGNOSIS — S52021D Displaced fracture of olecranon process without intraarticular extension of right ulna, subsequent encounter for closed fracture with routine healing: Secondary | ICD-10-CM | POA: Diagnosis not present

## 2023-06-26 DIAGNOSIS — M25621 Stiffness of right elbow, not elsewhere classified: Secondary | ICD-10-CM | POA: Diagnosis not present

## 2023-06-26 DIAGNOSIS — S52021D Displaced fracture of olecranon process without intraarticular extension of right ulna, subsequent encounter for closed fracture with routine healing: Secondary | ICD-10-CM | POA: Diagnosis not present

## 2023-06-27 DIAGNOSIS — L821 Other seborrheic keratosis: Secondary | ICD-10-CM | POA: Diagnosis not present

## 2023-06-27 DIAGNOSIS — D229 Melanocytic nevi, unspecified: Secondary | ICD-10-CM | POA: Diagnosis not present

## 2023-06-27 DIAGNOSIS — L578 Other skin changes due to chronic exposure to nonionizing radiation: Secondary | ICD-10-CM | POA: Diagnosis not present

## 2023-06-27 DIAGNOSIS — L814 Other melanin hyperpigmentation: Secondary | ICD-10-CM | POA: Diagnosis not present

## 2023-07-06 DIAGNOSIS — S52021A Displaced fracture of olecranon process without intraarticular extension of right ulna, initial encounter for closed fracture: Secondary | ICD-10-CM | POA: Diagnosis not present

## 2023-07-07 ENCOUNTER — Other Ambulatory Visit: Payer: Self-pay | Admitting: Medical Genetics

## 2023-07-07 DIAGNOSIS — Z006 Encounter for examination for normal comparison and control in clinical research program: Secondary | ICD-10-CM

## 2023-07-10 DIAGNOSIS — M25621 Stiffness of right elbow, not elsewhere classified: Secondary | ICD-10-CM | POA: Diagnosis not present

## 2023-07-10 DIAGNOSIS — S52021D Displaced fracture of olecranon process without intraarticular extension of right ulna, subsequent encounter for closed fracture with routine healing: Secondary | ICD-10-CM | POA: Diagnosis not present

## 2023-07-17 DIAGNOSIS — S52021D Displaced fracture of olecranon process without intraarticular extension of right ulna, subsequent encounter for closed fracture with routine healing: Secondary | ICD-10-CM | POA: Diagnosis not present

## 2023-07-17 DIAGNOSIS — M25621 Stiffness of right elbow, not elsewhere classified: Secondary | ICD-10-CM | POA: Diagnosis not present

## 2023-07-24 DIAGNOSIS — M25621 Stiffness of right elbow, not elsewhere classified: Secondary | ICD-10-CM | POA: Diagnosis not present

## 2023-07-24 DIAGNOSIS — S52021D Displaced fracture of olecranon process without intraarticular extension of right ulna, subsequent encounter for closed fracture with routine healing: Secondary | ICD-10-CM | POA: Diagnosis not present

## 2023-07-25 DIAGNOSIS — S52021D Displaced fracture of olecranon process without intraarticular extension of right ulna, subsequent encounter for closed fracture with routine healing: Secondary | ICD-10-CM | POA: Diagnosis not present

## 2023-08-01 ENCOUNTER — Other Ambulatory Visit (HOSPITAL_COMMUNITY)
Admission: RE | Admit: 2023-08-01 | Discharge: 2023-08-01 | Disposition: A | Discharge status: Home / Self Care | Payer: Self-pay | Source: Ambulatory Visit | Attending: Oncology | Admitting: Oncology

## 2023-08-01 DIAGNOSIS — Z006 Encounter for examination for normal comparison and control in clinical research program: Secondary | ICD-10-CM | POA: Insufficient documentation

## 2023-08-07 DIAGNOSIS — S52012D Torus fracture of upper end of left ulna, subsequent encounter for fracture with routine healing: Secondary | ICD-10-CM | POA: Diagnosis not present

## 2023-08-07 DIAGNOSIS — S52011D Torus fracture of upper end of right ulna, subsequent encounter for fracture with routine healing: Secondary | ICD-10-CM | POA: Diagnosis not present

## 2023-08-08 LAB — GENECONNECT MOLECULAR SCREEN: Genetic Analysis Overall Interpretation: NEGATIVE

## 2023-09-27 ENCOUNTER — Encounter: Payer: Self-pay | Admitting: Radiology

## 2023-09-27 DIAGNOSIS — R5381 Other malaise: Secondary | ICD-10-CM | POA: Diagnosis not present

## 2023-09-27 DIAGNOSIS — R0609 Other forms of dyspnea: Secondary | ICD-10-CM | POA: Diagnosis not present

## 2023-09-27 DIAGNOSIS — R109 Unspecified abdominal pain: Secondary | ICD-10-CM | POA: Diagnosis not present

## 2023-09-27 DIAGNOSIS — R5383 Other fatigue: Secondary | ICD-10-CM | POA: Diagnosis not present

## 2023-10-30 DIAGNOSIS — M546 Pain in thoracic spine: Secondary | ICD-10-CM | POA: Diagnosis not present

## 2023-10-30 DIAGNOSIS — R0602 Shortness of breath: Secondary | ICD-10-CM | POA: Diagnosis not present

## 2023-10-30 DIAGNOSIS — R072 Precordial pain: Secondary | ICD-10-CM | POA: Diagnosis not present

## 2023-10-30 DIAGNOSIS — R002 Palpitations: Secondary | ICD-10-CM | POA: Diagnosis not present

## 2023-11-01 ENCOUNTER — Encounter: Payer: Self-pay | Admitting: Radiology

## 2023-11-01 ENCOUNTER — Ambulatory Visit (INDEPENDENT_AMBULATORY_CARE_PROVIDER_SITE_OTHER): Payer: Self-pay | Admitting: Radiology

## 2023-11-01 VITALS — BP 112/64 | HR 101 | Ht 67.5 in | Wt 161.4 lb

## 2023-11-01 DIAGNOSIS — Z1331 Encounter for screening for depression: Secondary | ICD-10-CM | POA: Diagnosis not present

## 2023-11-01 DIAGNOSIS — N951 Menopausal and female climacteric states: Secondary | ICD-10-CM | POA: Diagnosis not present

## 2023-11-01 DIAGNOSIS — Z01419 Encounter for gynecological examination (general) (routine) without abnormal findings: Secondary | ICD-10-CM

## 2023-11-01 MED ORDER — ESTRADIOL 0.0375 MG/24HR TD PTTW
1.0000 | MEDICATED_PATCH | TRANSDERMAL | 3 refills | Status: AC
Start: 2023-11-02 — End: ?

## 2023-11-01 MED ORDER — PROGESTERONE MICRONIZED 100 MG PO CAPS
100.0000 mg | ORAL_CAPSULE | Freq: Every evening | ORAL | 3 refills | Status: DC
Start: 2023-11-01 — End: 2024-03-13

## 2023-11-01 NOTE — Progress Notes (Signed)
 [name] [name] [birth year] [id number]   History:  53 y.o. G2P2 presents for annual exam. Transfer from [facility]. C/o perimenopausal symptoms. Mood swings, night sweats, acid reflux,irregular bleeding before switching to Sprintec.   Gynecologic History Patient's last menstrual period was 10/23/2023 (exact date). Period Cycle (Days): 28 (with OCPs) Period Duration (Days): 5 Period Pattern: Regular Menstrual Flow: Moderate Menstrual Control: Tampon Dysmenorrhea: (!) Moderate Dysmenorrhea Symptoms: Cramping Contraception/Family planning: vasectomy Sexually active: yes Last Pap: 09/2021. Results were: normal Last mammogram: 10/24. Results were: abnormal, MRI guided biopsy negative. Repeat imaging scheduled this month  Obstetric History OB History  Gravida Para Term Preterm AB Living  2 2    2   SAB IAB Ectopic Multiple Live Births          # Outcome Date GA Lbr Len/2nd Weight Sex Type Anes PTL Lv  2 Para           1 Para                11/01/2023    2:17 PM  Depression screen PHQ 2/9  Decreased Interest 1  Down, Depressed, Hopeless 1  PHQ - 2 Score 2  Altered sleeping 2  Tired, decreased energy 2  Change in appetite 1  Feeling bad or failure about yourself  0  Trouble concentrating 3  Moving slowly or fidgety/restless 2  Suicidal thoughts 0  PHQ-9 Score 12  Difficult doing work/chores Somewhat difficult     The following portions of the patient's history were reviewed and updated as appropriate: allergies, current medications, past family history, past medical history, past social history, past surgical history, and problem list.  Review of Systems  All other systems reviewed and are negative.   Past medical history, past surgical history, family history and social history were all reviewed and documented in the EPIC chart.  Exam:  Vitals:   11/01/23 1414  BP: 112/64  Pulse: (!) 101  SpO2: 96%  Weight: 161 lb 6.4 oz (73.2 kg)  Height: 5' 7.5" (1.715 m)   Body mass  index is 24.91 kg/m.  Physical Exam Vitals and nursing note reviewed. Exam conducted with a chaperone present.  Constitutional:      Appearance: Normal appearance. She is normal weight.  HENT:     Head: Normocephalic and atraumatic.  Neck:     Thyroid: No thyroid mass, thyromegaly or thyroid tenderness.  Cardiovascular:     Rate and Rhythm: Regular rhythm.     Heart sounds: Normal heart sounds.  Pulmonary:     Effort: Pulmonary effort is normal.     Breath sounds: Normal breath sounds.  Chest:  Breasts:    Breasts are symmetrical.     Right: Normal. No inverted nipple, mass, nipple discharge, skin change or tenderness.     Left: Normal. No inverted nipple, mass, nipple discharge, skin change or tenderness.  Abdominal:     General: Abdomen is flat. Bowel sounds are normal.     Palpations: Abdomen is soft.  Genitourinary:    General: Normal vulva.     Vagina: Normal. No vaginal discharge, bleeding or lesions.     Cervix: Normal. No discharge or lesion.     Uterus: Normal. Not enlarged and not tender.      Adnexa: Right adnexa normal and left adnexa normal.       Right: No mass, tenderness or fullness.         Left: No mass, tenderness or fullness.    Lymphadenopathy:  Upper Body:     Right upper body: No axillary adenopathy.     Left upper body: No axillary adenopathy.  Skin:    General: Skin is warm and dry.  Neurological:     Mental Status: She is alert and oriented to person, place, and time.  Psychiatric:        Mood and Affect: Mood normal.        Thought Content: Thought content normal.        Judgment: Judgment normal.      Raynelle Fanning, CMA present for exam  Assessment/Plan:   1. Well woman exam with routine gynecological exam (Primary) Pap 2026  2. Perimenopausal Open to transitioning to HRT Risks and benefits reviewed Discussed bleeding pattern may change Follow up in 3 months  - estradiol (VIVELLE-DOT) 0.0375 MG/24HR; Place 1 patch onto the skin 2  (two) times a week.  Dispense: 8 patch; Refill: 3 - progesterone (PROMETRIUM) 100 MG capsule; Take 1 capsule (100 mg total) by mouth at bedtime.  Dispense: 30 capsule; Refill: 3    Return in about 3 months (around 02/01/2024) for Med Follow-up.  Arlie Solomons B WHNP-BC 2:45 PM 11/01/2023

## 2023-11-22 ENCOUNTER — Other Ambulatory Visit: Payer: BC Managed Care – PPO

## 2023-11-30 NOTE — Progress Notes (Unsigned)
 Cardiology Office Note:  .   Date:  12/01/2023  ID:  [name] [name], DOB 1971-08-19, MRN [id number] PCP: Blair Heys, MD (Inactive)  [facility] Providers Cardiologist:  None {   History of Present Illness: Marland Kitchen   [name] [name] is a 53 y.o. female with shortness of breath . We were asked to see her for further evaluation of her DOE  Started feeling poorly about a year ago  Started taking higher estrogen for menapause   Was an avid runner prior to this ( 15 mile runs several times a week ) Was seen in the ER [facility] . CTA of the lungs was negative for PE   Is no longer able to run   COVID vaccines, 2 initial shots  At lease 3 boosters  Has had COVID twice    Saw a new OB GYN who lowered her estrogen dose They lowered the estrogen  Pleuretic CP  Is feeling better , but is not completely better.  Is no longer running  Does ok walking at a moderate pace but cannot walk quickly due to chest pain , chest tightness  No leg swelling    Works as an Airline pilot  LDL is 106  Trigs = 50    Fam Hx No significant cardiac history  No fam history of hypercoaguablilty      ROS:   Studies Reviewed: Marland Kitchen   EKG Interpretation Date/Time:  Friday December 01 2023 08:46:46 EDT Ventricular Rate:  76 PR Interval:  124 QRS Duration:  80 QT Interval:  360 QTC Calculation: 405 R Axis:   82  Text Interpretation: Normal sinus rhythm Normal ECG When compared with ECG of 03-Feb-2023 15:32, PREVIOUS ECG IS PRESENT Confirmed by Kristeen Miss ([id number]) on 12/01/2023 9:08:59 AM     Risk Assessment/Calculations:             Physical Exam:   VS:  BP 110/76   Pulse 81   Ht 5' 7.5" (1.715 m)   Wt 160 lb 12.8 oz (72.9 kg)   LMP 10/23/2023 (Exact Date)   SpO2 98%   BMI 24.81 kg/m    Wt Readings from Last 3 Encounters:  12/01/23 160 lb 12.8 oz (72.9 kg)  11/01/23 161 lb 6.4 oz (73.2 kg)  05/10/23 160 lb (72.6 kg)    GEN: Well nourished, well developed in no acute distress NECK:  No JVD; No carotid bruits CARDIAC: Regular rate, soft systolic outflow murmur.  There is no radiation of the murmur out to the left axillary line. RESPIRATORY:  Clear to auscultation without rales, wheezing or rhonchi  ABDOMEN: Soft, non-tender, non-distended EXTREMITIES:  No edema; No deformity   ASSESSMENT AND PLAN: .   1.  Pleuritic chest pain: [name] [name] developed pleuritic chest pain last June.  Prior to that she was running 10-15 miles a day , several days a week.  Was training for the [facility] marathon .    She went to the [facility] and had a chest CTA which ruled out acute pulmonary embolus.  She had just been started on higher dose estrogen pills for menopause symptoms.  Her symptoms have improved somewhat now that she is on a lower dose of estrogen   It is possible that she has had small chronic pulmonary emboli that did not show up on the CTA of the chest.  I would like to do a VQ scan and pulmonary function test for further evaluation of this pleuritic chest pain and severe shortness of  breath.  Will also do an echocardiogram to assess her LV function and valvular function.  She has a trivial outflow murmur but I dont hear any severe valvular abnormalities to suggest severe MR or pulmonary HTN   She has been having some chest discomfort and increasing shortness of breath with exertion.  It should be noted that she was running 15 miles a day several days a week last year and now is unable to walk at any real speed.  Will get a coronary CT angiogram for further evaluation.  I would like to refer her to pulmonary medicine for further evaluation.  I will see her in follow-up on May 12 at 1140.      Dispo:   Signed, Kristeen Miss, MD

## 2023-12-01 ENCOUNTER — Encounter: Payer: Self-pay | Admitting: Cardiovascular Disease

## 2023-12-01 ENCOUNTER — Ambulatory Visit: Payer: BC Managed Care – PPO | Attending: Internal Medicine | Admitting: Cardiovascular Disease

## 2023-12-01 VITALS — BP 110/76 | HR 81 | Ht 67.5 in | Wt 160.8 lb

## 2023-12-01 DIAGNOSIS — R0781 Pleurodynia: Secondary | ICD-10-CM | POA: Insufficient documentation

## 2023-12-01 DIAGNOSIS — R262 Difficulty in walking, not elsewhere classified: Secondary | ICD-10-CM

## 2023-12-01 DIAGNOSIS — Z01810 Encounter for preprocedural cardiovascular examination: Secondary | ICD-10-CM

## 2023-12-01 DIAGNOSIS — T385X5D Adverse effect of other estrogens and progestogens, subsequent encounter: Secondary | ICD-10-CM

## 2023-12-01 DIAGNOSIS — J984 Other disorders of lung: Secondary | ICD-10-CM

## 2023-12-01 DIAGNOSIS — R0609 Other forms of dyspnea: Secondary | ICD-10-CM | POA: Insufficient documentation

## 2023-12-01 DIAGNOSIS — T385X5A Adverse effect of other estrogens and progestogens, initial encounter: Secondary | ICD-10-CM

## 2023-12-01 DIAGNOSIS — R071 Chest pain on breathing: Secondary | ICD-10-CM

## 2023-12-01 MED ORDER — METOPROLOL TARTRATE 100 MG PO TABS: ORAL_TABLET | ORAL | 0 refills | Status: DC

## 2023-12-01 NOTE — Patient Instructions (Addendum)
 Testing/Procedures: ECHO Your physician has requested that you have an echocardiogram. Echocardiography is a painless test that uses sound waves to create images of your heart. It provides your doctor with information about the size and shape of your heart and how well your heart's chambers and valves are working. This procedure takes approximately one hour. There are no restrictions for this procedure. Please do NOT wear cologne, perfume, aftershave, or lotions (deodorant is allowed). Please arrive 15 minutes prior to your appointment time.  Please note: We ask at that you not bring children with you during ultrasound (echo/ vascular) testing. Due to room size and safety concerns, children are not allowed in the ultrasound rooms during exams. Our front office staff cannot provide observation of children in our lobby area while testing is being conducted. An adult accompanying a patient to their appointment will only be allowed in the ultrasound room at the discretion of the ultrasound technician under special circumstances. We apologize for any inconvenience.  VQ scan (ventilation/perfusion scan)  Pulmonary Function Testing Your physician has recommended that you have a pulmonary function test. Pulmonary Function Tests are a group of tests that measure how well air moves in and out of your lungs.  Coronary CT Angiogram Non-Cardiac CT Angiography (CTA), is a special type of CT scan that uses a computer to produce multi-dimensional views of major blood vessels throughout the body. In CT angiography, a contrast material is injected through an IV to help visualize the blood vessels  Ambulatory referral to Pulmonology  Chest X-ray A chest x-ray takes a picture of the organs and structures inside the chest, including the heart, lungs, and blood vessels. This test can show several things, including, whether the heart is enlarges; whether fluid is building up in the lungs; and whether pacemaker /  defibrillator leads are still in place.  They can call the [facility], as long as it has been scheduled.  [phone]   Follow-Up: At [facility], you and your health needs are our priority.  As part of our continuing mission to provide you with exceptional heart care, our providers are all part of one team.  This team includes your primary Cardiologist (physician) and Advanced Practice Providers or APPs (Physician Assistants and Nurse Practitioners) who all work together to provide you with the care you need, when you need it.  Your next appointment:   As Scheduled  Provider:   Kristeen Miss, MD    Other Instructions   Your cardiac CT will be scheduled at:   [facility] [address] [place], [place] [address] [phone]  please arrive at the [facility] and Children's Entrance (Entrance C2) of [facility] 30 minutes prior to test start time. You can use the FREE valet parking offered at entrance C (encouraged to control the heart rate for the test)  Proceed to the [facility] Radiology Department (first floor) to check-in and test prep.  All radiology patients and guests should use entrance C2 at [facility], accessed from [facility], even though the hospital's physical address listed is [address].     Please follow these instructions carefully (unless otherwise directed):  An IV will be required for this test and Nitroglycerin will be given.   On the Night Before the Test: Be sure to Drink plenty of water. Do not consume any caffeinated/decaffeinated beverages or chocolate 12 hours prior to your test. Do not take any antihistamines 12 hours prior to your test.  On the Day of the Test: Drink plenty of water until 1 hour prior to the test. Do not eat any food 1 hour prior to test. You may take your regular medications prior to the test.  Take metoprolol (Lopressor) two hours prior to  test. Patients who wear a continuous glucose monitor MUST remove the device prior to scanning. FEMALES- please wear underwire-free bra if available, avoid dresses & tight clothing      After the Test: Drink plenty of water. After receiving IV contrast, you may experience a mild flushed feeling. This is normal. On occasion, you may experience a mild rash up to 24 hours after the test. This is not dangerous. If this occurs, you can take Benadryl 25 mg, Zyrtec, Claritin, or Allegra and increase your fluid intake. (Patients taking Tikosyn should avoid Benadryl, and may take Zyrtec, Claritin, or Allegra) If you experience trouble breathing, this can be serious. If it is severe call 911 IMMEDIATELY. If it is mild, please call our office.  We will call to schedule your test 2-4 weeks out understanding that some insurance companies will need an authorization prior to the service being performed.   For more information and frequently asked questions, please visit our website : [email or web address]  For non-scheduling related questions, please contact the cardiac imaging nurse navigator should you have any questions/concerns: Cardiac Imaging Nurse Navigators Direct Office Dial: [phone]   For scheduling needs, including cancellations and rescheduling, please call [place], [phone].    1st Floor: - Lobby - Registration  - Pharmacy  - Lab - Cafe  2nd Floor: - PV Lab - Diagnostic Testing (echo, CT, nuclear med)  3rd Floor: - Vacant  4th Floor: - TCTS (cardiothoracic surgery) - AFib Clinic - Structural Heart Clinic - Vascular Surgery  - Vascular Ultrasound  5th Floor: - HeartCare Cardiology (general and EP) - Clinical Pharmacy for coumadin, hypertension, lipid, weight-loss medications, and med management appointments    Valet parking services will be available as well.

## 2023-12-05 DIAGNOSIS — F3181 Bipolar II disorder: Secondary | ICD-10-CM | POA: Diagnosis not present

## 2023-12-07 ENCOUNTER — Ambulatory Visit: Admitting: Internal Medicine

## 2023-12-07 ENCOUNTER — Other Ambulatory Visit: Payer: BC Managed Care – PPO

## 2023-12-18 ENCOUNTER — Other Ambulatory Visit

## 2023-12-18 ENCOUNTER — Ambulatory Visit
Admission: RE | Admit: 2023-12-18 | Discharge: 2023-12-18 | Disposition: A | Discharge status: Home / Self Care | Source: Ambulatory Visit | Attending: Obstetrics and Gynecology | Admitting: Obstetrics and Gynecology

## 2023-12-18 ENCOUNTER — Ambulatory Visit
Admission: RE | Admit: 2023-12-18 | Discharge: 2023-12-18 | Disposition: A | Discharge status: Home / Self Care | Source: Ambulatory Visit | Attending: Cardiovascular Disease | Admitting: Cardiovascular Disease

## 2023-12-18 ENCOUNTER — Encounter: Payer: Self-pay | Admitting: Cardiovascular Disease

## 2023-12-18 ENCOUNTER — Encounter

## 2023-12-18 ENCOUNTER — Encounter (HOSPITAL_COMMUNITY): Payer: Self-pay

## 2023-12-18 DIAGNOSIS — R071 Chest pain on breathing: Secondary | ICD-10-CM

## 2023-12-18 DIAGNOSIS — R0781 Pleurodynia: Secondary | ICD-10-CM

## 2023-12-18 DIAGNOSIS — N6002 Solitary cyst of left breast: Secondary | ICD-10-CM | POA: Diagnosis not present

## 2023-12-18 DIAGNOSIS — N6320 Unspecified lump in the left breast, unspecified quadrant: Secondary | ICD-10-CM

## 2023-12-18 DIAGNOSIS — R0600 Dyspnea, unspecified: Secondary | ICD-10-CM | POA: Diagnosis not present

## 2023-12-18 DIAGNOSIS — J984 Other disorders of lung: Secondary | ICD-10-CM

## 2023-12-18 DIAGNOSIS — R928 Other abnormal and inconclusive findings on diagnostic imaging of breast: Secondary | ICD-10-CM

## 2023-12-18 DIAGNOSIS — N6323 Unspecified lump in the left breast, lower outer quadrant: Secondary | ICD-10-CM | POA: Diagnosis not present

## 2023-12-18 NOTE — Telephone Encounter (Signed)
 Error

## 2023-12-19 ENCOUNTER — Encounter: Payer: Self-pay | Admitting: Cardiovascular Disease

## 2023-12-19 ENCOUNTER — Ambulatory Visit (HOSPITAL_COMMUNITY)
Admission: RE | Admit: 2023-12-19 | Discharge: 2023-12-19 | Disposition: A | Discharge status: Home / Self Care | Source: Ambulatory Visit | Attending: Cardiovascular Disease | Admitting: Cardiovascular Disease

## 2023-12-19 DIAGNOSIS — J984 Other disorders of lung: Secondary | ICD-10-CM | POA: Insufficient documentation

## 2023-12-19 DIAGNOSIS — R262 Difficulty in walking, not elsewhere classified: Secondary | ICD-10-CM | POA: Diagnosis not present

## 2023-12-19 DIAGNOSIS — R0781 Pleurodynia: Secondary | ICD-10-CM | POA: Diagnosis not present

## 2023-12-19 DIAGNOSIS — R079 Chest pain, unspecified: Secondary | ICD-10-CM | POA: Diagnosis not present

## 2023-12-19 DIAGNOSIS — R0609 Other forms of dyspnea: Secondary | ICD-10-CM | POA: Insufficient documentation

## 2023-12-19 DIAGNOSIS — R071 Chest pain on breathing: Secondary | ICD-10-CM | POA: Diagnosis not present

## 2023-12-19 DIAGNOSIS — T385X5A Adverse effect of other estrogens and progestogens, initial encounter: Secondary | ICD-10-CM | POA: Insufficient documentation

## 2023-12-19 LAB — BASIC METABOLIC PANEL WITH GFR
BUN/Creatinine Ratio: 9 (ref 9–23)
BUN: 8 mg/dL (ref 6–24)
CO2: 22 mmol/L (ref 20–29)
Calcium: 9.6 mg/dL (ref 8.7–10.2)
Chloride: 102 mmol/L (ref 96–106)
Creatinine, Ser: 0.85 mg/dL (ref 0.57–1.00)
Glucose: 92 mg/dL (ref 70–99)
Potassium: 4.3 mmol/L (ref 3.5–5.2)
Sodium: 139 mmol/L (ref 134–144)
eGFR: 83 mL/min/{1.73_m2} (ref >59)

## 2023-12-19 MED ORDER — TECHNETIUM TO 99M ALBUMIN AGGREGATED
4.0000 | Freq: Once | INTRAVENOUS | Status: AC | PRN
  Administered 2023-12-19: 4 via INTRAVENOUS

## 2023-12-20 ENCOUNTER — Ambulatory Visit (HOSPITAL_COMMUNITY)
Admission: RE | Admit: 2023-12-20 | Discharge: 2023-12-20 | Discharge status: Home / Self Care | Source: Ambulatory Visit | Attending: Cardiovascular Disease | Admitting: Cardiovascular Disease

## 2023-12-20 ENCOUNTER — Encounter: Payer: Self-pay | Admitting: Cardiovascular Disease

## 2023-12-20 ENCOUNTER — Encounter (HOSPITAL_COMMUNITY): Payer: Self-pay

## 2023-12-20 DIAGNOSIS — R071 Chest pain on breathing: Secondary | ICD-10-CM

## 2023-12-20 DIAGNOSIS — T385X5A Adverse effect of other estrogens and progestogens, initial encounter: Secondary | ICD-10-CM

## 2023-12-20 DIAGNOSIS — R0781 Pleurodynia: Secondary | ICD-10-CM

## 2023-12-20 DIAGNOSIS — J984 Other disorders of lung: Secondary | ICD-10-CM

## 2023-12-20 DIAGNOSIS — R262 Difficulty in walking, not elsewhere classified: Secondary | ICD-10-CM

## 2023-12-20 DIAGNOSIS — R0609 Other forms of dyspnea: Secondary | ICD-10-CM

## 2023-12-20 MED ORDER — NITROGLYCERIN 0.4 MG SL SUBL
SUBLINGUAL_TABLET | SUBLINGUAL | Status: AC
  Filled 2023-12-20: qty 2

## 2023-12-20 MED ORDER — METOPROLOL TARTRATE 5 MG/5ML IV SOLN: 10.0000 mg | Freq: Once | INTRAVENOUS | Status: DC | PRN

## 2023-12-20 MED ORDER — DILTIAZEM HCL 25 MG/5ML IV SOLN: 10.0000 mg | INTRAVENOUS | Status: DC | PRN

## 2023-12-20 MED ORDER — NITROGLYCERIN 0.4 MG SL SUBL: 0.8000 mg | SUBLINGUAL_TABLET | Freq: Once | SUBLINGUAL | Status: DC

## 2023-12-22 ENCOUNTER — Encounter (HOSPITAL_COMMUNITY)

## 2023-12-22 DIAGNOSIS — Z0000 Encounter for general adult medical examination without abnormal findings: Secondary | ICD-10-CM | POA: Diagnosis not present

## 2023-12-25 ENCOUNTER — Telehealth: Payer: Self-pay | Admitting: Cardiovascular Disease

## 2023-12-25 NOTE — Telephone Encounter (Signed)
 New Message:     Patient is scheduled for a CT on 01-10-24 She said they had to cx the previous one, because she was so scared and tensed, they could not do it She said they told her to see if her doctor could call in something to help her relax before she takes the test please.

## 2023-12-26 ENCOUNTER — Encounter: Payer: Self-pay | Admitting: Cardiovascular Disease

## 2023-12-27 NOTE — Telephone Encounter (Signed)
 Returned call to patient to inform her about calling PCP to get antianxiety medication. She states that she understands and will try to do the best she can.

## 2023-12-29 DIAGNOSIS — E7841 Elevated Lipoprotein(a): Secondary | ICD-10-CM | POA: Diagnosis not present

## 2023-12-29 DIAGNOSIS — Z0000 Encounter for general adult medical examination without abnormal findings: Secondary | ICD-10-CM | POA: Diagnosis not present

## 2024-01-02 ENCOUNTER — Ambulatory Visit (HOSPITAL_COMMUNITY): Attending: Cardiology

## 2024-01-07 ENCOUNTER — Encounter: Payer: Self-pay | Admitting: Cardiovascular Disease

## 2024-01-07 NOTE — Progress Notes (Unsigned)
  Cardiology Office Note:  .   Date:  01/08/2024  ID:  [name] [name], DOB 1970/10/26, MRN [id number] PCP: [name] Memory, MD (Inactive)  [facility] Providers Cardiologist:  None {   History of Present Illness: [name] [name]   [name] [name] is a 53 y.o. female with shortness of breath . We were asked to see her for further evaluation of her DOE  Started feeling poorly about a year ago  Started taking higher estrogen for menapause   Was an avid runner prior to this ( 15 mile runs several times a week ) Was seen in the ER [facility] . CTA of the lungs was negative for PE   Is no longer able to run   COVID vaccines, 2 initial shots  At lease 3 boosters  Has had COVID twice    Saw a new OB GYN who lowered her estrogen dose They lowered the estrogen  Pleuretic CP  Is feeling better , but is not completely better.  Is no longer running  Does ok walking at a moderate pace but cannot walk quickly due to chest pain , chest tightness  No leg swelling    Works as an Airline pilot  LDL is 106  Trigs = 50    Fam Hx No significant cardiac history  No fam history of hypercoaguablilty    Jan 08, 2024 [name] [name] is seen for follow up of her severe DOE She is scheduled to have her coronary CTA on May 14. We ordered an echo but it has not been completed yet  VQ scan revealed no evidence of acute or chronic pulmonary embolus  Still having issue with DOE and lightheadedness with exercise    No significant family hx of premature CAD    Recent labs from primary MD  Chol = 203 Trigs = 64 HDL= 76 LDL = 115   LP(a) is 97       ROS:   Studies Reviewed: .         Risk Assessment/Calculations:     Physical Exam:    Physical Exam: Blood pressure (!) 102/56, pulse 76, height 5\' 8"  (1.727 m), weight 163 lb 3.2 oz (74 kg), SpO2 96%.       GEN:  Well nourished, well developed in no acute distress HEENT: Normal NECK: No JVD; No carotid bruits LYMPHATICS: No  lymphadenopathy CARDIAC: RRR , very soft systolic murmur at LSB .[name] [name] RESPIRATORY:  Clear to auscultation without rales, wheezing or rhonchi  ABDOMEN: Soft, non-tender, non-distended MUSCULOSKELETAL:  No edema; No deformity  SKIN: Warm and dry NEUROLOGIC:  Alert and oriented x 3   ASSESSMENT AND PLAN: .   1.  Pleuritic chest pain:   she denies any pleuritic chest pain at this time.  VQ scan was negative for any chronic or acute pulmonary emboli.  2.  I would like to get an echocardiogram to evaluate her cardiac size and function and primarily to make sure she does not have pulmonary hypertension.  She is also scheduled to have coronary CT angiogram to rule out obstructive CAD.  I will see her back in the office on June 20 at 11:40.         Dispo:   Signed, Ahmad Alert, MD

## 2024-01-08 ENCOUNTER — Encounter: Payer: Self-pay | Admitting: Cardiovascular Disease

## 2024-01-08 ENCOUNTER — Encounter

## 2024-01-08 ENCOUNTER — Ambulatory Visit: Attending: Cardiovascular Disease | Admitting: Cardiovascular Disease

## 2024-01-08 ENCOUNTER — Other Ambulatory Visit

## 2024-01-08 ENCOUNTER — Telehealth (HOSPITAL_COMMUNITY): Payer: Self-pay | Admitting: Cardiovascular Disease

## 2024-01-08 VITALS — BP 102/56 | HR 76 | Ht 68.0 in | Wt 163.2 lb

## 2024-01-08 DIAGNOSIS — R0609 Other forms of dyspnea: Secondary | ICD-10-CM

## 2024-01-08 NOTE — Telephone Encounter (Signed)
 Patient NO SHOWED ECHOCARDIOGRAM on 01/02/24. I called to reschedule and patient states she is seeing cardiologist this am and will see if she needs to reschedule.

## 2024-01-08 NOTE — Telephone Encounter (Signed)
 Will send this message to the pts Primary Cardiologist and covering RN as a general FYI.

## 2024-01-08 NOTE — Patient Instructions (Signed)
 Testing/Procedures: ECHO (rescheduled) Your physician has requested that you have an echocardiogram. Echocardiography is a painless test that uses sound waves to create images of your heart. It provides your doctor with information about the size and shape of your heart and how well your heart's chambers and valves are working. This procedure takes approximately one hour. There are no restrictions for this procedure. Please do NOT wear cologne, perfume, aftershave, or lotions (deodorant is allowed). Please arrive 15 minutes prior to your appointment time.  Please note: We ask at that you not bring children with you during ultrasound (echo/ vascular) testing. Due to room size and safety concerns, children are not allowed in the ultrasound rooms during exams. Our front office staff cannot provide observation of children in our lobby area while testing is being conducted. An adult accompanying a patient to their appointment will only be allowed in the ultrasound room at the discretion of the ultrasound technician under special circumstances. We apologize for any inconvenience.  Follow-Up: At [facility], you and your health needs are our priority.  As part of our continuing mission to provide you with exceptional heart care, our providers are all part of one team.  This team includes your primary Cardiologist (physician) and Advanced Practice Providers or APPs (Physician Assistants and Nurse Practitioners) who all work together to provide you with the care you need, when you need it.  Your next appointment:   As Scheduled  Provider:   Ahmad Alert, MD

## 2024-01-09 ENCOUNTER — Other Ambulatory Visit (HOSPITAL_COMMUNITY): Payer: Self-pay | Admitting: *Deleted

## 2024-01-09 ENCOUNTER — Encounter: Payer: Self-pay | Admitting: Cardiovascular Disease

## 2024-01-09 MED ORDER — METOPROLOL TARTRATE 100 MG PO TABS: ORAL_TABLET | ORAL | 0 refills | Status: DC

## 2024-01-10 ENCOUNTER — Ambulatory Visit (HOSPITAL_COMMUNITY)
Admission: RE | Admit: 2024-01-10 | Discharge: 2024-01-10 | Disposition: A | Discharge status: Home / Self Care | Source: Ambulatory Visit | Attending: Cardiovascular Disease | Admitting: Cardiovascular Disease

## 2024-01-10 ENCOUNTER — Other Ambulatory Visit

## 2024-01-10 ENCOUNTER — Other Ambulatory Visit: Payer: Self-pay | Admitting: Cardiovascular Disease

## 2024-01-10 ENCOUNTER — Encounter

## 2024-01-10 DIAGNOSIS — R262 Difficulty in walking, not elsewhere classified: Secondary | ICD-10-CM | POA: Diagnosis not present

## 2024-01-10 DIAGNOSIS — R0609 Other forms of dyspnea: Secondary | ICD-10-CM

## 2024-01-10 DIAGNOSIS — J984 Other disorders of lung: Secondary | ICD-10-CM

## 2024-01-10 DIAGNOSIS — T385X5A Adverse effect of other estrogens and progestogens, initial encounter: Secondary | ICD-10-CM

## 2024-01-10 DIAGNOSIS — R071 Chest pain on breathing: Secondary | ICD-10-CM

## 2024-01-10 DIAGNOSIS — Z01810 Encounter for preprocedural cardiovascular examination: Secondary | ICD-10-CM

## 2024-01-10 DIAGNOSIS — R0781 Pleurodynia: Secondary | ICD-10-CM

## 2024-01-10 DIAGNOSIS — Z0189 Encounter for other specified special examinations: Secondary | ICD-10-CM | POA: Diagnosis not present

## 2024-01-10 MED ORDER — NITROGLYCERIN 0.4 MG SL SUBL
0.8000 mg | SUBLINGUAL_TABLET | Freq: Once | SUBLINGUAL | Status: AC
  Administered 2024-01-10: 0.8 mg via SUBLINGUAL

## 2024-01-10 MED ORDER — IOHEXOL 350 MG/ML SOLN
100.0000 mL | Freq: Once | INTRAVENOUS | Status: AC | PRN
  Administered 2024-01-10: 100 mL via INTRAVENOUS

## 2024-01-10 NOTE — Progress Notes (Signed)
 Patient presented for a cardiac CT. IV was started by the aid of an ultrasound machine in patient's right AC.  Patient's IV was tested with a saline flush. However during the test, the IV extravasated. The CT tech estimates that about 50ml of IV contrast went into the patient's arm.  Edema noted to patient's arm below the injection site. Patient reported some tenderness above the injection site.  No redness noted and patient has full range of movement. Dr. Micael Adas called and patient is ok to go home. Education given to patient about how to care for site. IV removed, pressure dressing and ice pack applied. Information about about when to seek medical attention given. Patient verbalized understanding of instructions.

## 2024-01-11 ENCOUNTER — Telehealth (HOSPITAL_COMMUNITY): Payer: Self-pay | Admitting: *Deleted

## 2024-01-11 ENCOUNTER — Ambulatory Visit: Payer: Self-pay | Admitting: Cardiovascular Disease

## 2024-01-11 NOTE — Telephone Encounter (Signed)
 Reaching out to patient regarding her extravasation in her right arm. Patient reports swelling has decreased and there is overall improvement. Patient thankful for follow up call.  Chase Copping RN Navigator Cardiac Imaging [facility] [phone] Office [phone] Cell

## 2024-01-30 DIAGNOSIS — S52011D Torus fracture of upper end of right ulna, subsequent encounter for fracture with routine healing: Secondary | ICD-10-CM | POA: Diagnosis not present

## 2024-01-31 ENCOUNTER — Ambulatory Visit: Admitting: Radiology

## 2024-02-09 ENCOUNTER — Other Ambulatory Visit: Payer: Self-pay | Admitting: Radiology

## 2024-02-09 DIAGNOSIS — N951 Menopausal and female climacteric states: Secondary | ICD-10-CM

## 2024-02-12 ENCOUNTER — Ambulatory Visit (HOSPITAL_COMMUNITY)

## 2024-02-12 NOTE — Telephone Encounter (Signed)
 See MyChart message from patient dated 02/09/24, pending response from patient.

## 2024-02-12 NOTE — Telephone Encounter (Signed)
 See refill encounter dated 02/09/24.   Encounter closed.

## 2024-02-12 NOTE — Telephone Encounter (Signed)
 Med refill request:estradiol  0.0375 mg patch twice weekly  Last AEX: 11/01/23 -JC Next AEX: OV 03/13/24 -46mo f/u Last MMG (if hormonal med) Dx MMG 12/18/23 -BiRads 1, neg Refill authorized: Please Advise?  MyChart message to patient.

## 2024-02-14 ENCOUNTER — Encounter (HOSPITAL_BASED_OUTPATIENT_CLINIC_OR_DEPARTMENT_OTHER): Payer: Self-pay

## 2024-02-14 ENCOUNTER — Encounter: Payer: Self-pay | Admitting: Cardiovascular Disease

## 2024-02-14 NOTE — Progress Notes (Signed)
 This encounter was created in error - please disregard.

## 2024-02-16 ENCOUNTER — Encounter: Admitting: Cardiovascular Disease

## 2024-03-11 DIAGNOSIS — M2550 Pain in unspecified joint: Secondary | ICD-10-CM | POA: Diagnosis not present

## 2024-03-11 DIAGNOSIS — R519 Headache, unspecified: Secondary | ICD-10-CM | POA: Diagnosis not present

## 2024-03-11 DIAGNOSIS — R5383 Other fatigue: Secondary | ICD-10-CM | POA: Diagnosis not present

## 2024-03-13 ENCOUNTER — Ambulatory Visit: Admitting: Radiology

## 2024-03-13 ENCOUNTER — Encounter: Payer: Self-pay | Admitting: Radiology

## 2024-03-13 VITALS — BP 120/68 | HR 86 | Wt 163.0 lb

## 2024-03-13 DIAGNOSIS — N951 Menopausal and female climacteric states: Secondary | ICD-10-CM

## 2024-03-13 MED ORDER — ESTRADIOL 0.05 MG/24HR TD PTTW: 1.0000 | MEDICATED_PATCH | TRANSDERMAL | 3 refills | Status: AC

## 2024-03-13 MED ORDER — PROGESTERONE MICRONIZED 100 MG PO CAPS: 200.0000 mg | ORAL_CAPSULE | Freq: Every evening | ORAL | 3 refills | Status: AC

## 2024-03-13 NOTE — Progress Notes (Signed)
   [name] [name] 08/22/1971 [id number]   History:  53 y.o. G2P2 presents for follow up after starting HRT for perimenopausal symptoms. Was having mood swings, night sweats, acid reflux,irregular bleeding before switching to Sprintec. Symptoms are significantly better but not resolved.   Gynecologic History Patient's last menstrual period was 02/27/2024 (approximate).   Contraception/Family planning: vasectomy Sexually active: yes Last Pap: 09/2021. Results were: normal Last mammogram: 10/24. Results were: abnormal, MRI guided biopsy negative. Repeat imaging scheduled this month  Obstetric History OB History  Gravida Para Term Preterm AB Living  2 2    2   SAB IAB Ectopic Multiple Live Births          # Outcome Date GA Lbr Len/2nd Weight Sex Type Anes PTL Lv  2 Para           1 Para                11/01/2023    2:17 PM  Depression screen PHQ 2/9  Decreased Interest 1  Down, Depressed, Hopeless 1  PHQ - 2 Score 2  Altered sleeping 2  Tired, decreased energy 2  Change in appetite 1  Feeling bad or failure about yourself  0  Trouble concentrating 3  Moving slowly or fidgety/restless 2  Suicidal thoughts 0  PHQ-9 Score 12  Difficult doing work/chores Somewhat difficult     The following portions of the patient's history were reviewed and updated as appropriate: allergies, current medications, past family history, past medical history, past social history, past surgical history, and problem list.  Review of Systems  All other systems reviewed and are negative.   Past medical history, past surgical history, family history and social history were all reviewed and documented in the EPIC chart.  Exam:  Vitals:   03/13/24 0848  BP: 120/68  Pulse: 86  SpO2: 98%  Weight: 163 lb (73.9 kg)    Body mass index is 24.78 kg/m. Physical Exam Constitutional:      Appearance: Normal appearance. She is normal weight.  Pulmonary:     Effort: Pulmonary effort is normal.   Neurological:     Mental Status: She is alert.  Psychiatric:        Mood and Affect: Mood normal.        Thought Content: Thought content normal.        Judgment: Judgment normal.    Assessment/Plan:   1. Perimenopausal (Primary) - progesterone  (PROMETRIUM ) 100 MG capsule; Take 2 capsules (200 mg total) by mouth at bedtime.  Dispense: 180 capsule; Refill: 3 - estradiol  (VIVELLE -DOT) 0.05 MG/24HR patch; Place 1 patch (0.05 mg total) onto the skin 2 (two) times a week.  Dispense: 24 patch; Refill: 3   AEX due 10/2024  GINETTE COZIER B WHNP-BC 9:06 AM 03/13/2024

## 2024-03-20 DIAGNOSIS — L821 Other seborrheic keratosis: Secondary | ICD-10-CM | POA: Diagnosis not present

## 2024-03-20 DIAGNOSIS — L720 Epidermal cyst: Secondary | ICD-10-CM | POA: Diagnosis not present

## 2024-03-20 DIAGNOSIS — L918 Other hypertrophic disorders of the skin: Secondary | ICD-10-CM | POA: Diagnosis not present

## 2024-03-20 DIAGNOSIS — Z411 Encounter for cosmetic surgery: Secondary | ICD-10-CM | POA: Diagnosis not present

## 2024-04-08 DIAGNOSIS — M7072 Other bursitis of hip, left hip: Secondary | ICD-10-CM | POA: Diagnosis not present

## 2024-04-08 DIAGNOSIS — M25552 Pain in left hip: Secondary | ICD-10-CM | POA: Diagnosis not present

## 2024-04-08 DIAGNOSIS — Z0184 Encounter for antibody response examination: Secondary | ICD-10-CM | POA: Diagnosis not present

## 2024-04-26 ENCOUNTER — Telehealth: Payer: Self-pay | Admitting: Cardiovascular Disease

## 2024-04-26 NOTE — Telephone Encounter (Signed)
 Spoke with pt who wanted to know if any of her scans would be able to show if she had myocarditis or not. Explained that due to not being able to get the CCTA completed, we don't have any results that would have specifically noted myocarditis. Explained to pt that it looks like she had canceled an echo appt previously and we can send a message to get this rescheduled for her and then make an appt with one of our providers to discuss results and next steps. Due to not being able to get a CCTA, Dr. Alveta had previously wanted to get an echo (noted on the CT modified cal score image from 01/10/24). Pt agreeable to plan. Message sent to Olam Ford to get echo rescheduled and once date is confirmed, clinic appt will be made. Pt stated the only dates she can not have an appt made on are 9/10 and 10/17.

## 2024-04-26 NOTE — Telephone Encounter (Signed)
 Pt requesting a c/b in regards to testing that was done in April 2025.

## 2024-05-01 DIAGNOSIS — M7062 Trochanteric bursitis, left hip: Secondary | ICD-10-CM | POA: Diagnosis not present

## 2024-05-01 DIAGNOSIS — M461 Sacroiliitis, not elsewhere classified: Secondary | ICD-10-CM | POA: Diagnosis not present

## 2024-05-01 DIAGNOSIS — M25552 Pain in left hip: Secondary | ICD-10-CM | POA: Diagnosis not present

## 2024-05-02 ENCOUNTER — Ambulatory Visit (HOSPITAL_COMMUNITY)
Admission: RE | Admit: 2024-05-02 | Discharge: 2024-05-02 | Disposition: A | Discharge status: Home / Self Care | Source: Ambulatory Visit | Attending: Cardiology | Admitting: Cardiology

## 2024-05-02 DIAGNOSIS — T385X5D Adverse effect of other estrogens and progestogens, subsequent encounter: Secondary | ICD-10-CM | POA: Diagnosis not present

## 2024-05-02 DIAGNOSIS — R0609 Other forms of dyspnea: Secondary | ICD-10-CM | POA: Diagnosis not present

## 2024-05-02 DIAGNOSIS — R262 Difficulty in walking, not elsewhere classified: Secondary | ICD-10-CM | POA: Insufficient documentation

## 2024-05-02 DIAGNOSIS — R0781 Pleurodynia: Secondary | ICD-10-CM | POA: Insufficient documentation

## 2024-05-02 DIAGNOSIS — J984 Other disorders of lung: Secondary | ICD-10-CM | POA: Diagnosis not present

## 2024-05-02 DIAGNOSIS — R071 Chest pain on breathing: Secondary | ICD-10-CM | POA: Insufficient documentation

## 2024-05-02 DIAGNOSIS — T385X5A Adverse effect of other estrogens and progestogens, initial encounter: Secondary | ICD-10-CM | POA: Insufficient documentation

## 2024-05-02 LAB — ECHOCARDIOGRAM COMPLETE
Area-P 1/2: 3.39 cm2
S' Lateral: 3.2 cm

## 2024-05-06 DIAGNOSIS — M7062 Trochanteric bursitis, left hip: Secondary | ICD-10-CM | POA: Diagnosis not present

## 2024-05-06 DIAGNOSIS — M461 Sacroiliitis, not elsewhere classified: Secondary | ICD-10-CM | POA: Diagnosis not present

## 2024-05-06 DIAGNOSIS — M25552 Pain in left hip: Secondary | ICD-10-CM | POA: Diagnosis not present

## 2024-05-06 DIAGNOSIS — Z23 Encounter for immunization: Secondary | ICD-10-CM | POA: Diagnosis not present

## 2024-05-10 DIAGNOSIS — M461 Sacroiliitis, not elsewhere classified: Secondary | ICD-10-CM | POA: Diagnosis not present

## 2024-05-10 DIAGNOSIS — M7062 Trochanteric bursitis, left hip: Secondary | ICD-10-CM | POA: Diagnosis not present

## 2024-05-10 DIAGNOSIS — M25552 Pain in left hip: Secondary | ICD-10-CM | POA: Diagnosis not present

## 2024-05-13 DIAGNOSIS — M7062 Trochanteric bursitis, left hip: Secondary | ICD-10-CM | POA: Diagnosis not present

## 2024-05-13 DIAGNOSIS — M461 Sacroiliitis, not elsewhere classified: Secondary | ICD-10-CM | POA: Diagnosis not present

## 2024-05-13 DIAGNOSIS — M25552 Pain in left hip: Secondary | ICD-10-CM | POA: Diagnosis not present

## 2024-05-17 DIAGNOSIS — M461 Sacroiliitis, not elsewhere classified: Secondary | ICD-10-CM | POA: Diagnosis not present

## 2024-05-17 DIAGNOSIS — M25552 Pain in left hip: Secondary | ICD-10-CM | POA: Diagnosis not present

## 2024-05-17 DIAGNOSIS — M7062 Trochanteric bursitis, left hip: Secondary | ICD-10-CM | POA: Diagnosis not present

## 2024-05-20 DIAGNOSIS — M25552 Pain in left hip: Secondary | ICD-10-CM | POA: Diagnosis not present

## 2024-05-20 DIAGNOSIS — R1030 Lower abdominal pain, unspecified: Secondary | ICD-10-CM | POA: Diagnosis not present

## 2024-05-20 DIAGNOSIS — M7062 Trochanteric bursitis, left hip: Secondary | ICD-10-CM | POA: Diagnosis not present

## 2024-05-20 DIAGNOSIS — L988 Other specified disorders of the skin and subcutaneous tissue: Secondary | ICD-10-CM | POA: Diagnosis not present

## 2024-05-20 DIAGNOSIS — M461 Sacroiliitis, not elsewhere classified: Secondary | ICD-10-CM | POA: Diagnosis not present

## 2024-05-20 DIAGNOSIS — R229 Localized swelling, mass and lump, unspecified: Secondary | ICD-10-CM | POA: Diagnosis not present

## 2024-05-20 DIAGNOSIS — R140 Abdominal distension (gaseous): Secondary | ICD-10-CM | POA: Diagnosis not present

## 2024-05-24 DIAGNOSIS — M7062 Trochanteric bursitis, left hip: Secondary | ICD-10-CM | POA: Diagnosis not present

## 2024-05-24 DIAGNOSIS — M461 Sacroiliitis, not elsewhere classified: Secondary | ICD-10-CM | POA: Diagnosis not present

## 2024-05-24 DIAGNOSIS — M25552 Pain in left hip: Secondary | ICD-10-CM | POA: Diagnosis not present

## 2024-05-28 ENCOUNTER — Encounter: Payer: Self-pay | Admitting: Internal Medicine

## 2024-05-28 ENCOUNTER — Ambulatory Visit: Attending: Internal Medicine | Admitting: Internal Medicine

## 2024-05-28 VITALS — BP 128/80 | HR 68 | Ht 68.0 in | Wt 168.6 lb

## 2024-05-28 DIAGNOSIS — R0781 Pleurodynia: Secondary | ICD-10-CM

## 2024-05-28 DIAGNOSIS — F419 Anxiety disorder, unspecified: Secondary | ICD-10-CM | POA: Insufficient documentation

## 2024-05-28 DIAGNOSIS — R0609 Other forms of dyspnea: Secondary | ICD-10-CM | POA: Diagnosis not present

## 2024-05-28 NOTE — Progress Notes (Signed)
 Cardiology Office Note:  .    Date:  05/28/2024  ID:  [name] [name], DOB Jun 11, 1971, MRN [id number] PCP: Verdia Lombard, MD  [facility] Providers Cardiologist:  None     CC: Transition to new cardiologist  History of Present Illness: [name] [name]    [name] [name] is a 53 y.o. female who presents with shortness of breath and chest discomfort. She was referred by Dr. Alveta for evaluation of her shortness of breath and chest discomfort.  She experiences shortness of breath, particularly during physical activities such as walking, and sometimes even while at rest. Episodes of back pain behind her heart occasionally make her feel as though she is having a heart attack. Despite a history of panic attacks, she notes that these episodes do not feel like panic attacks as she remains calm and unstressed during them.  She has undergone several diagnostic tests, including an echocardiogram which showed a structurally normal heart, and pulmonary function tests which were clear. A CT scan was attempted twice, but complications with the IV led to an incomplete study. During the procedure, her arm swelled, raising concerns about her veins.  She tried Prilosec for 14 days to address potential GERD, but it did not alleviate her symptoms. Her symptoms have significantly impacted her ability to run, which she used to do regularly, and now she experiences shortness of breath even during routine activities like walking.  Her symptoms began around the spring of 2024, with a notable episode leading to an emergency room visit on February 02, 2023. Since then, she has been referred to a cardiologist and has undergone various evaluations without a definitive diagnosis.  No family history of heart problems, but there is a family history of diabetes with her father and grandmother affected.   Relevant histories: .  Social  -former PN patient ROS: As per HPI.   Studies Reviewed: .     Cardiac Studies &  Procedures   ______________________________________________________________________________________________     ECHOCARDIOGRAM  ECHOCARDIOGRAM COMPLETE 05/02/2024  Narrative ECHOCARDIOGRAM REPORT    Patient Name:   [name] [name] Pund Date of Exam: 05/02/2024 Medical Rec #:  [id number]     Height:       68.0 in Accession #:    [id number]    Weight:       163.0 lb Date of Birth:  1971/06/03     BSA:          1.874 m Patient Age:    53 years      BP:           102/56 mmHg Patient Gender: F             HR:           72 bpm. Exam Location:  [address]  Procedure: 2D Echo, Cardiac Doppler, Color Doppler, 3D Echo and Strain Analysis (Both Spectral and Color Flow Doppler were utilized during procedure).  Indications:    R06.09 SOB  History:        Patient has no prior history of Echocardiogram examinations. Signs/Symptoms:Shortness of Breath and Pleuritic chest pain.  Sonographer:    Elsie Bohr RDCS Referring Phys: [phone] PHILIP J NAHSER  IMPRESSIONS   1. Left ventricular ejection fraction, by estimation, is 60 to 65%. Left ventricular ejection fraction by 3D volume is 58 %. The left ventricle has normal function. The left ventricle has no regional wall motion abnormalities. Left ventricular diastolic parameters were normal. The average left ventricular global longitudinal strain  is -23.8 %. The global longitudinal strain is normal. 2. Right ventricular systolic function is normal. The right ventricular size is normal. 3. The mitral valve is normal in structure. Trivial mitral valve regurgitation. No evidence of mitral stenosis. 4. The aortic valve is normal in structure. Aortic valve regurgitation is not visualized. No aortic stenosis is present. 5. The inferior vena cava is normal in size with greater than 50% respiratory variability, suggesting right atrial pressure of 3 mmHg.  FINDINGS Left Ventricle: Left ventricular ejection fraction, by estimation, is 60 to 65%. Left  ventricular ejection fraction by 3D volume is 58 %. The left ventricle has normal function. The left ventricle has no regional wall motion abnormalities. The average left ventricular global longitudinal strain is -23.8 %. Strain was performed and the global longitudinal strain is normal. The left ventricular internal cavity size was normal in size. There is no left ventricular hypertrophy. Left ventricular diastolic parameters were normal.  Right Ventricle: The right ventricular size is normal. No increase in right ventricular wall thickness. Right ventricular systolic function is normal.  Left Atrium: Left atrial size was normal in size.  Right Atrium: Right atrial size was normal in size.  Pericardium: There is no evidence of pericardial effusion.  Mitral Valve: The mitral valve is normal in structure. Trivial mitral valve regurgitation. No evidence of mitral valve stenosis.  Tricuspid Valve: The tricuspid valve is normal in structure. Tricuspid valve regurgitation is trivial. No evidence of tricuspid stenosis.  Aortic Valve: The aortic valve is normal in structure. Aortic valve regurgitation is not visualized. No aortic stenosis is present.  Pulmonic Valve: The pulmonic valve was normal in structure. Pulmonic valve regurgitation is not visualized. No evidence of pulmonic stenosis.  Aorta: The aortic root is normal in size and structure.  Venous: The inferior vena cava is normal in size with greater than 50% respiratory variability, suggesting right atrial pressure of 3 mmHg.  IAS/Shunts: No atrial level shunt detected by color flow Doppler.  Additional Comments: 3D was performed not requiring image post processing on an independent workstation and was normal.   LEFT VENTRICLE PLAX 2D LVIDd:         4.70 cm         Diastology LVIDs:         3.20 cm         LV e' medial:    8.81 cm/s LV PW:         1.00 cm         LV E/e' medial:  8.9 LV IVS:        0.80 cm         LV e' lateral:    14.10 cm/s LVOT diam:     1.80 cm         LV E/e' lateral: 5.6 LV SV:         53 LV SV Index:   28              2D Longitudinal LVOT Area:     2.54 cm        Strain 2D Strain GLS   -23.6 % (A4C): 2D Strain GLS   -21.7 % (A3C): 2D Strain GLS   -26.3 % (A2C): 2D Strain GLS   -23.8 % Avg:  3D Volume EF LV 3D EF:    Left ventricul ar ejection fraction by 3D volume is 58 %.  3D Volume EF: 3D EF:        58 % LV  EDV:       125 ml LV ESV:       52 ml LV SV:        73 ml  RIGHT VENTRICLE             IVC RV S prime:     11.30 cm/s  IVC diam: 2.00 cm TAPSE (M-mode): 2.5 cm RVSP:           25.5 mmHg  LEFT ATRIUM           Index        RIGHT ATRIUM           Index LA diam:      2.90 cm 1.55 cm/m   RA Pressure: 3.00 mmHg LA Vol (A2C): 46.0 ml 24.55 ml/m  RA Area:     11.50 cm LA Vol (A4C): 46.7 ml 24.93 ml/m  RA Volume:   29.10 ml  15.53 ml/m AORTIC VALVE LVOT Vmax:   97.40 cm/s LVOT Vmean:  65.800 cm/s LVOT VTI:    0.209 m  AORTA Ao Root diam: 3.00 cm Ao Asc diam:  3.00 cm  MITRAL VALVE               TRICUSPID VALVE MV Area (PHT): 3.39 cm    TR Peak grad:   22.5 mmHg MV Decel Time: 224 msec    TR Vmax:        237.00 cm/s MV E velocity: 78.60 cm/s  Estimated RAP:  3.00 mmHg MV A velocity: 84.40 cm/s  RVSP:           25.5 mmHg MV E/A ratio:  0.93 SHUNTS Systemic VTI:  0.21 m Systemic Diam: 1.80 cm  Aditya Sabharwal Electronically signed by Ria Commander Signature Date/Time: 05/02/2024/5:20:50 PM    Final          ______________________________________________________________________________________________      Physical Exam:    VS:  BP 128/80 (BP Location: Right Arm, Patient Position: Sitting, Cuff Size: Small)   Pulse 68   Ht 5' 8 (1.727 m)   Wt 168 lb 9.6 oz (76.5 kg)   BMI 25.64 kg/m    Wt Readings from Last 3 Encounters:  05/28/24 168 lb 9.6 oz (76.5 kg)  03/13/24 163 lb (73.9 kg)  01/08/24 163 lb 3.2 oz (74 kg)    Gen: no distress    Neck: No JVD Cardiac: No Rubs or Gallops, no Murmur, RRR +2 radial pulses Respiratory: Clear to auscultation bilaterally, normal effort, normal  respiratory rate GI: Soft, nontender, non-distended  MS: No  edema;  moves all extremities Integument: Skin feels warm Neuro:  At time of evaluation, alert and oriented to person/place/time/situation  Psych: Normal affect, patient feels ok  ASSESSMENT AND PLAN: .    Exertional dyspnea Platypnea SOB Hx of Anxiety - Intermittent exertional dyspnea and chest/back pressure since spring 2024, particularly during physical activity such as walking uphill. Previous evaluations including pulmonary function tests and echocardiogram were normal. CT scan was incomplete due to IV infiltration. Differential diagnosis includes cardiac causes, with suspicion for possible pulmonary hypertension or right-sided heart dysfunction due to right heart and IVC at upper limits of normal. - Schedule a stress test using a recumbent bike on June 19, 2024, to evaluate for pulmonary hypertension or right-sided heart dysfunction during exertion.  Abnormal cardiac evaluation Previous cardiac evaluations including echocardiogram and CT scan did not reveal significant abnormalities. No evidence of ischemia or significant blockages. Right heart and IVC measurements at upper limits  of normal. No family history of heart disease, though family history of diabetes is noted. - Monitor cardiac function during the scheduled stress test on June 19, 2024, to further evaluate cardiac function under exertion.  Stanly Leavens, MD FASE [facility] Cardiologist [facility]  [address] [place], [address] [place], [place] [address] [phone]  5:52 PM

## 2024-05-28 NOTE — Patient Instructions (Signed)
 Medication Instructions:  Your physician recommends that you continue on your current medications as directed. Please refer to the Current Medication list given to you today.  *If you need a refill on your cardiac medications before your next appointment, please call your pharmacy*  Lab Work: NONE  If you have labs (blood work) drawn today and your tests are completely normal, you will receive your results only by: MyChart Message (if you have MyChart) OR A paper copy in the mail If you have any lab test that is abnormal or we need to change your treatment, we will call you to review the results.  Testing/Procedures: Your physician has requested that you have an echocardiogram on [facility]. Echocardiography is a painless test that uses sound waves to create images of your heart. It provides your doctor with information about the size and shape of your heart and how well your heart's chambers and valves are working. This procedure takes approximately one hour. There are no restrictions for this procedure. Please do NOT wear cologne, perfume, aftershave, or lotions (deodorant is allowed). Please arrive 15 minutes prior to your appointment time.  Please note: We ask at that you not bring children with you during ultrasound (echo/ vascular) testing. Due to room size and safety concerns, children are not allowed in the ultrasound rooms during exams. Our front office staff cannot provide observation of children in our lobby area while testing is being conducted. An adult accompanying a patient to their appointment will only be allowed in the ultrasound room at the discretion of the ultrasound technician under special circumstances. We apologize for any inconvenience.   Follow-Up:As needed  At [facility], you and your health needs are our priority.  As part of our continuing mission to provide you with exceptional heart care, our providers are all part of one team.  This team includes your  primary Cardiologist (physician) and Advanced Practice Providers or APPs (Physician Assistants and Nurse Practitioners) who all work together to provide you with the care you need, when you need it.   Provider:   Stanly Leavens, MD

## 2024-05-29 ENCOUNTER — Encounter: Payer: Self-pay | Admitting: Internal Medicine

## 2024-05-29 DIAGNOSIS — F3181 Bipolar II disorder: Secondary | ICD-10-CM | POA: Diagnosis not present

## 2024-05-31 DIAGNOSIS — M7062 Trochanteric bursitis, left hip: Secondary | ICD-10-CM | POA: Diagnosis not present

## 2024-05-31 DIAGNOSIS — M25552 Pain in left hip: Secondary | ICD-10-CM | POA: Diagnosis not present

## 2024-05-31 DIAGNOSIS — M461 Sacroiliitis, not elsewhere classified: Secondary | ICD-10-CM | POA: Diagnosis not present

## 2024-05-31 DIAGNOSIS — Z23 Encounter for immunization: Secondary | ICD-10-CM | POA: Diagnosis not present

## 2024-06-03 ENCOUNTER — Other Ambulatory Visit (HOSPITAL_COMMUNITY): Payer: Self-pay | Admitting: Internal Medicine

## 2024-06-03 ENCOUNTER — Other Ambulatory Visit: Payer: Self-pay

## 2024-06-03 DIAGNOSIS — I272 Pulmonary hypertension, unspecified: Secondary | ICD-10-CM

## 2024-06-03 DIAGNOSIS — I5189 Other ill-defined heart diseases: Secondary | ICD-10-CM

## 2024-06-05 DIAGNOSIS — M7062 Trochanteric bursitis, left hip: Secondary | ICD-10-CM | POA: Diagnosis not present

## 2024-06-05 DIAGNOSIS — M25552 Pain in left hip: Secondary | ICD-10-CM | POA: Diagnosis not present

## 2024-06-05 DIAGNOSIS — M461 Sacroiliitis, not elsewhere classified: Secondary | ICD-10-CM | POA: Diagnosis not present

## 2024-06-07 DIAGNOSIS — M461 Sacroiliitis, not elsewhere classified: Secondary | ICD-10-CM | POA: Diagnosis not present

## 2024-06-07 DIAGNOSIS — M25552 Pain in left hip: Secondary | ICD-10-CM | POA: Diagnosis not present

## 2024-06-10 DIAGNOSIS — M461 Sacroiliitis, not elsewhere classified: Secondary | ICD-10-CM | POA: Diagnosis not present

## 2024-06-10 DIAGNOSIS — M7062 Trochanteric bursitis, left hip: Secondary | ICD-10-CM | POA: Diagnosis not present

## 2024-06-10 DIAGNOSIS — M25552 Pain in left hip: Secondary | ICD-10-CM | POA: Diagnosis not present

## 2024-06-12 DIAGNOSIS — M461 Sacroiliitis, not elsewhere classified: Secondary | ICD-10-CM | POA: Diagnosis not present

## 2024-06-12 DIAGNOSIS — M7062 Trochanteric bursitis, left hip: Secondary | ICD-10-CM | POA: Diagnosis not present

## 2024-06-12 DIAGNOSIS — M25552 Pain in left hip: Secondary | ICD-10-CM | POA: Diagnosis not present

## 2024-06-27 ENCOUNTER — Telehealth (HOSPITAL_COMMUNITY): Payer: Self-pay | Admitting: *Deleted

## 2024-06-27 NOTE — Telephone Encounter (Signed)
 Instructions for stress echo left on pt's vm per dpr.

## 2024-07-03 ENCOUNTER — Other Ambulatory Visit: Payer: Self-pay | Admitting: Internal Medicine

## 2024-07-03 DIAGNOSIS — L814 Other melanin hyperpigmentation: Secondary | ICD-10-CM | POA: Diagnosis not present

## 2024-07-03 DIAGNOSIS — L820 Inflamed seborrheic keratosis: Secondary | ICD-10-CM | POA: Diagnosis not present

## 2024-07-03 DIAGNOSIS — L821 Other seborrheic keratosis: Secondary | ICD-10-CM | POA: Diagnosis not present

## 2024-07-03 DIAGNOSIS — R0609 Other forms of dyspnea: Secondary | ICD-10-CM

## 2024-07-03 DIAGNOSIS — L578 Other skin changes due to chronic exposure to nonionizing radiation: Secondary | ICD-10-CM | POA: Diagnosis not present

## 2024-07-03 DIAGNOSIS — D229 Melanocytic nevi, unspecified: Secondary | ICD-10-CM | POA: Diagnosis not present

## 2024-07-04 ENCOUNTER — Ambulatory Visit (HOSPITAL_COMMUNITY)
Admission: RE | Admit: 2024-07-04 | Discharge: 2024-07-04 | Disposition: A | Source: Ambulatory Visit | Attending: Cardiovascular Disease

## 2024-07-04 ENCOUNTER — Ambulatory Visit (HOSPITAL_COMMUNITY)
Admission: RE | Admit: 2024-07-04 | Discharge: 2024-07-04 | Disposition: A | Discharge status: Home / Self Care | Source: Ambulatory Visit | Attending: Cardiovascular Disease | Admitting: Cardiovascular Disease

## 2024-07-04 DIAGNOSIS — I272 Pulmonary hypertension, unspecified: Secondary | ICD-10-CM | POA: Insufficient documentation

## 2024-07-04 DIAGNOSIS — I5189 Other ill-defined heart diseases: Secondary | ICD-10-CM | POA: Diagnosis not present

## 2024-07-04 LAB — ECHOCARDIOGRAM STRESS TEST
Area-P 1/2: 4.29 cm2
S' Lateral: 3 cm

## 2024-07-05 ENCOUNTER — Ambulatory Visit: Payer: Self-pay | Admitting: *Deleted

## 2024-09-18 ENCOUNTER — Emergency Department (HOSPITAL_COMMUNITY)

## 2024-09-18 ENCOUNTER — Emergency Department (HOSPITAL_BASED_OUTPATIENT_CLINIC_OR_DEPARTMENT_OTHER)

## 2024-09-18 ENCOUNTER — Other Ambulatory Visit: Payer: Self-pay

## 2024-09-18 ENCOUNTER — Emergency Department (HOSPITAL_BASED_OUTPATIENT_CLINIC_OR_DEPARTMENT_OTHER)
Admission: EM | Admit: 2024-09-18 | Discharge: 2024-09-18 | Disposition: A | Discharge status: Home / Self Care | Attending: Emergency Medicine | Admitting: Emergency Medicine

## 2024-09-18 DIAGNOSIS — H538 Other visual disturbances: Secondary | ICD-10-CM | POA: Insufficient documentation

## 2024-09-18 DIAGNOSIS — H5340 Unspecified visual field defects: Secondary | ICD-10-CM

## 2024-09-18 DIAGNOSIS — R519 Headache, unspecified: Secondary | ICD-10-CM | POA: Diagnosis not present

## 2024-09-18 DIAGNOSIS — G44209 Tension-type headache, unspecified, not intractable: Secondary | ICD-10-CM

## 2024-09-18 LAB — URINALYSIS, ROUTINE W REFLEX MICROSCOPIC
Bilirubin Urine: NEGATIVE
Glucose, UA: NEGATIVE mg/dL
Hgb urine dipstick: NEGATIVE
Ketones, ur: NEGATIVE mg/dL
Leukocytes,Ua: NEGATIVE
Nitrite: NEGATIVE
Protein, ur: NEGATIVE mg/dL
Specific Gravity, Urine: <1.005 — ABNORMAL LOW (ref 1.005–1.030)
pH: 6 (ref 5.0–8.0)

## 2024-09-18 LAB — CBC WITH DIFFERENTIAL/PLATELET
Abs Immature Granulocytes: 0.02 K/uL (ref 0.00–0.07)
Basophils Absolute: 0.1 K/uL (ref 0.0–0.1)
Basophils Relative: 1 %
Eosinophils Absolute: 0.1 K/uL (ref 0.0–0.5)
Eosinophils Relative: 1 %
HCT: 38.6 % (ref 36.0–46.0)
Hemoglobin: 13.2 g/dL (ref 12.0–15.0)
Immature Granulocytes: 0 %
Lymphocytes Relative: 32 %
Lymphs Abs: 2.6 K/uL (ref 0.7–4.0)
MCH: 31.5 pg (ref 26.0–34.0)
MCHC: 34.2 g/dL (ref 30.0–36.0)
MCV: 92.1 fL (ref 80.0–100.0)
Monocytes Absolute: 0.6 K/uL (ref 0.1–1.0)
Monocytes Relative: 8 %
Neutro Abs: 4.7 K/uL (ref 1.7–7.7)
Neutrophils Relative %: 58 %
Platelets: 226 K/uL (ref 150–400)
RBC: 4.19 MIL/uL (ref 3.87–5.11)
RDW: 11.4 % — ABNORMAL LOW (ref 11.5–15.5)
WBC: 8.1 K/uL (ref 4.0–10.5)
nRBC: 0 % (ref 0.0–0.2)

## 2024-09-18 LAB — COMPREHENSIVE METABOLIC PANEL WITH GFR
ALT: 16 U/L (ref 0–44)
AST: 23 U/L (ref 15–41)
Albumin: 4.8 g/dL (ref 3.5–5.0)
Alkaline Phosphatase: 57 U/L (ref 38–126)
Anion gap: 12 (ref 5–15)
BUN: 8 mg/dL (ref 6–20)
CO2: 26 mmol/L (ref 22–32)
Calcium: 9.6 mg/dL (ref 8.9–10.3)
Chloride: 101 mmol/L (ref 98–111)
Creatinine, Ser: 0.81 mg/dL (ref 0.44–1.00)
GFR, Estimated: >60 mL/min
Glucose, Bld: 94 mg/dL (ref 70–99)
Potassium: 3.7 mmol/L (ref 3.5–5.1)
Sodium: 139 mmol/L (ref 135–145)
Total Bilirubin: 0.5 mg/dL (ref 0.0–1.2)
Total Protein: 7.1 g/dL (ref 6.5–8.1)

## 2024-09-18 LAB — VITAMIN B12: Vitamin B-12: 553 pg/mL (ref 180–914)

## 2024-09-18 LAB — HCG, SERUM, QUALITATIVE: Preg, Serum: NEGATIVE

## 2024-09-18 LAB — MAGNESIUM: Magnesium: 2 mg/dL (ref 1.7–2.4)

## 2024-09-18 MED ORDER — PROCHLORPERAZINE MALEATE 10 MG PO TABS: 10.0000 mg | ORAL_TABLET | Freq: Two times a day (BID) | ORAL | 0 refills | Status: AC | PRN

## 2024-09-18 MED ORDER — NAPROXEN 375 MG PO TABS: 375.0000 mg | ORAL_TABLET | Freq: Two times a day (BID) | ORAL | 0 refills | Status: AC

## 2024-09-18 NOTE — ED Provider Notes (Signed)
 54 yo female seen earlier today at [facility] new onset of visual disturbances described as sparks in peripheral vision BL, heaviness in legs, and feeling of swollen tongue while speaking. Pt also endorses 2 weeks of headaches. Pt denies history of headaches. Workup at [facility] was negative for acute intracranial bleed or any other concerning findings. Continued concern for TIA or CVA so patient directed to [facility] for MRI for further evaluation. On presentation to [facility] ED patient endorses visual symptoms are improved but still notices feeling of swollen tongue while speaking.  Physical Exam  BP (!) 141/61 (BP Location: Left Arm)   Pulse 73   Temp 98.2 F (36.8 C) (Oral)   Resp 20   SpO2 100%   Physical Exam Vitals and nursing note reviewed.  Constitutional:      General: She is not in acute distress.    Appearance: She is well-developed.  HENT:     Head: Normocephalic and atraumatic.  Eyes:     Conjunctiva/sclera: Conjunctivae normal.  Cardiovascular:     Rate and Rhythm: Normal rate and regular rhythm.     Heart sounds: No murmur heard. Pulmonary:     Effort: Pulmonary effort is normal. No respiratory distress.     Breath sounds: Normal breath sounds.  Abdominal:     Palpations: Abdomen is soft.     Tenderness: There is no abdominal tenderness.  Musculoskeletal:        General: No swelling.     Cervical back: Neck supple.  Skin:    General: Skin is warm and dry.     Capillary Refill: Capillary refill takes less than 2 seconds.  Neurological:     Mental Status: She is alert and oriented to person, place, and time.  Psychiatric:        Mood and Affect: Mood normal.      Procedures  Procedures  ED Course / MDM    Medical Decision Making Amount and/or Complexity of Data Reviewed Labs: ordered. Radiology: ordered.  Risk Prescription drug management.  This patient presents to the ED for concern of Headaches with visual disturbances, this involves  an extensive number of treatment options, and is a complaint that carries with it a high risk of complications and morbidity.  The differential diagnosis includes CVA vs intracranial bleed vs TIA vs migraine with aura    Additional history obtained:  Additional history obtained from [facility] ED note External records from outside source obtained and reviewed including N/A   Lab Tests:  I Ordered, and personally interpreted labs.  The pertinent results include: No leukocytosis, no electrolyte derangements.  Normal B12, normal mag.   Imaging Studies ordered:  I ordered imaging studies including CT Head, MR Brain I independently visualized and interpreted imaging which showed no evidence of intracranial pathology I agree with the radiologist interpretation   Cardiac Monitoring:  The patient was maintained on a cardiac monitor.  I personally viewed and interpreted the cardiac monitored which showed an underlying rhythm of: Normal Sinus rhythm    Consultations Obtained:  I discussed this case with my attending physician who cosigned this note including patient's presenting symptoms, physical exam, and planned diagnostics and interventions. Attending physician stated agreement with plan or made changes to plan which were implemented.   Problem List / ED Course:  54 year old female presented to ED at [facility] complaining of headache and new visual disturbances.  Patient endorses headache for 2 weeks but this morning started developing sparks in her peripheral  vision bilaterally.  Patient also endorsed feeling like she was walking through sand and feeling like her tongue was swollen while speaking.  Patient denies migraine history.  CT head showed no abnormalities, but concern for intracranial bleed, CVA, TIA remained so patient was sent to [facility] for MR head. On presentation patient endorsed most symptoms had resolved, though continue to experience mild headache.  MRI  head showed no intracranial abnormalities.  Lab work including B12, serum preg, urinalysis, mag were unremarkable. Discussed patient with attending.  Symptoms may represent complex migraine with aura. Given clean MRI, unremarkable lab work, resolution of visual symptoms, discharge with outpatient neuro referral and return precautions.   Reevaluation:  After the interventions noted above, I reevaluated the patient and found that they have: improved   Social Determinants of Health:  No impacting social determinants.   Dispostion:  After consideration of the diagnostic results and the patients response to treatment, I feel that the patent would benefit from discharge with neuro follow-up and return precautions.       Harold Tillman ONEIDA DEVONNA 09/18/24 [id number]    Patt Alm Macho, MD 09/18/24 [phone]

## 2024-09-18 NOTE — ED Triage Notes (Signed)
 Reports visual field change of sparks in bilateral eyes (lasted approx 20 secs) with bilateral feet and tongue heaviness.   No unilateral deficits and no difficulty with words.

## 2024-09-18 NOTE — ED Provider Notes (Signed)
 " [facility] AT [facility] Provider Note   CSN: [id number] Arrival date & time: 09/18/24  1320     Patient presents with: Visual Field Change   [name] [name] is a 54 y.o. female who presents to the ED today with primary concern of what she describes as as a appearance of sparks in the periphery of her vision bilaterally.  This lasted for about 15 to 20 seconds while she was in the shower earlier today, was not accompanied by any vertigo or dizziness.  She has not also endorsed any nausea or vomiting, has had a frontal headache that is centered in the middle of her forehead and does feel like it is radiating to the occiput.  This has been chronic for some time however she says it is notable today.  Does not take any medication for same and does not have any previous diagnosis of migraines.  Further, she states that her feet feel heavy, stating it feels like I am walking in sand.  She also states that she feels like her tongue is flattened, though she does not state that she has any facial numbness, or dysphagia.  She does have a history of bipolar 2 disorder, and medications include estrogen and progesterone , as well as Lamictal.   HPI     Prior to Admission medications  Medication Sig Start Date End Date Taking? Authorizing Provider  estradiol  (VIVELLE -DOT) 0.05 MG/24HR patch Place 1 patch (0.05 mg total) onto the skin 2 (two) times a week. 03/14/24   Chrzanowski, Jami B, NP  ibuprofen (ADVIL) 200 MG tablet 1 tablet with food or milk as needed Orally twice a day 10/30/23   [provider]  lamoTRIgine (LAMICTAL) 100 MG tablet lamotrigine 100 mg tablet  TAKE 1 TABLET BY MOUTH EVERY MORNING AND 1 AND 1/2 TABLETS AT BEDTIME 09/19/16   [provider]  progesterone  (PROMETRIUM ) 100 MG capsule Take 2 capsules (200 mg total) by mouth at bedtime. 03/13/24   Chrzanowski, Jami B, NP    Allergies: Patient has no known allergies.    Review of Systems   Eyes:  Positive for visual disturbance.  All other systems reviewed and are negative.   Updated Vital Signs BP 118/67   Pulse 69   Temp 98.7 F (37.1 C)   Resp 17   SpO2 100%   Physical Exam Vitals and nursing note reviewed.  Constitutional:      General: She is not in acute distress.    Appearance: Normal appearance.  HENT:     Head: Normocephalic and atraumatic.     Mouth/Throat:     Mouth: Mucous membranes are moist.     Pharynx: Oropharynx is clear.  Eyes:     General: No visual field deficit.    Extraocular Movements: Extraocular movements intact.     Conjunctiva/sclera: Conjunctivae normal.     Pupils: Pupils are equal, round, and reactive to light.  Cardiovascular:     Rate and Rhythm: Normal rate and regular rhythm.     Pulses: Normal pulses.     Heart sounds: Normal heart sounds. No murmur heard.    No friction rub. No gallop.  Pulmonary:     Effort: Pulmonary effort is normal.     Breath sounds: Normal breath sounds.  Abdominal:     General: Abdomen is flat. Bowel sounds are normal.     Palpations: Abdomen is soft.  Musculoskeletal:        General: Normal range of  motion.     Cervical back: Normal range of motion and neck supple.     Right lower leg: No edema.     Left lower leg: No edema.  Skin:    General: Skin is warm and dry.     Capillary Refill: Capillary refill takes less than 2 seconds.  Neurological:     General: No focal deficit present.     Mental Status: She is alert and oriented to person, place, and time. Mental status is at baseline.     GCS: GCS eye subscore is 4. GCS verbal subscore is 5. GCS motor subscore is 6.     Cranial Nerves: No cranial nerve deficit, dysarthria or facial asymmetry.     Sensory: No sensory deficit.     Motor: No weakness, tremor or abnormal muscle tone.     Coordination: Romberg sign negative. Coordination normal. Finger-Nose-Finger Test and Heel to [facility] Test normal.     Gait: Gait is intact.     Deep Tendon  Reflexes: Reflexes are normal and symmetric.  Psychiatric:        Mood and Affect: Mood normal.     (all labs ordered are listed, but only abnormal results are displayed) Labs Reviewed  CBC WITH DIFFERENTIAL/PLATELET - Abnormal; Notable for the following components:      Result Value   RDW 11.4 (*)    All other components within normal limits  URINALYSIS, ROUTINE W REFLEX MICROSCOPIC - Abnormal; Notable for the following components:   Color, Urine COLORLESS (*)    Specific Gravity, Urine <1.005 (*)    All other components within normal limits  COMPREHENSIVE METABOLIC PANEL WITH GFR  HCG, SERUM, QUALITATIVE    EKG: EKG Interpretation Date/Time:  Wednesday September 18 2024 14:50:53 EST Ventricular Rate:  71 PR Interval:  124 QRS Duration:  93 QT Interval:  402 QTC Calculation: 437 R Axis:   82  Text Interpretation: Sinus rhythm RSR' in V1 or V2, probably normal variant Confirmed by Tonia Chew [phone]) on 09/18/2024 2:52:15 PM  Radiology: CT Head Wo Contrast Result Date: 09/18/2024 CLINICAL DATA:  Visual changes with bilateral feet and tongue heaviness. EXAM: CT HEAD WITHOUT CONTRAST TECHNIQUE: Contiguous axial images were obtained from the base of the skull through the vertex without intravenous contrast. RADIATION DOSE REDUCTION: This exam was performed according to the departmental dose-optimization program which includes automated exposure control, adjustment of the mA and/or kV according to patient size and/or use of iterative reconstruction technique. COMPARISON:  None Available. FINDINGS: Brain: No evidence of acute infarction, hemorrhage, hydrocephalus, extra-axial collection or mass lesion/mass effect. Vascular: No hyperdense vessel or unexpected calcification. Skull: Normal. Negative for fracture or focal lesion. Sinuses/Orbits: No acute finding. Other: None. IMPRESSION: No acute intracranial pathology. Electronically Signed   By: Suzen Dials M.D.   On: 09/18/2024  14:23     Procedures   Medications Ordered in the ED - No data to display                                  Medical Decision Making Amount and/or Complexity of Data Reviewed Labs: ordered. Radiology: ordered.   Medical Decision Making:   Starlena Beil Cando is a 54 y.o. female who presented to the ED today with visual disturbance and sensation of heaviness in the lower limbs detailed above.    Additional history discussed with patient's family/caregivers.  Complete initial physical exam performed, notably  the patient  was alert and oriented in no apparent distress.  Neuroexam is completely unremarkable with no deficits and noted in the upper or lower extremities, ambulatory without assistance using normal gait, no dysmetria, cranial nerves completely intact.     Reviewed and confirmed nursing documentation for past medical history, family history, social history.    Initial Assessment:   With the patient's presentation of visual disturbance and heavy sensation in the lower limbs, consider acute neurovascular insult, TIA, further consider possible new onset of multiple sclerosis, also consider electrolyte abnormality.  Initial Plan:  CT of the head to evaluate for acute intracranial bleed or other neurovascular insult Consider possible MRI imaging pending results of workup. Screening labs including CBC and Metabolic panel to evaluate for infectious or metabolic etiology of disease.  Urinalysis with reflex culture ordered to evaluate for UTI or relevant urologic/nephrologic pathology.  Evaluate B12 and serum magnesium secondary to numbness and paresthesia Serum pregnancy secondary to risk of pregnancy, pharmacotherapy and imaging. EKG to evaluate for cardiac pathology Objective evaluation as below reviewed   Initial Study Results:   Laboratory  All laboratory results reviewed without evidence of clinically relevant pathology.   Exceptions include: Normal, pending magnesium and  B12 EKG EKG was reviewed independently. Rate, rhythm, axis, intervals all examined and without medically relevant abnormality. ST segments without concerns for elevations.    Radiology:  All images reviewed independently. Agree with radiology report at this time.   CT Head Wo Contrast Result Date: 09/18/2024 CLINICAL DATA:  Visual changes with bilateral feet and tongue heaviness. EXAM: CT HEAD WITHOUT CONTRAST TECHNIQUE: Contiguous axial images were obtained from the base of the skull through the vertex without intravenous contrast. RADIATION DOSE REDUCTION: This exam was performed according to the departmental dose-optimization program which includes automated exposure control, adjustment of the mA and/or kV according to patient size and/or use of iterative reconstruction technique. COMPARISON:  None Available. FINDINGS: Brain: No evidence of acute infarction, hemorrhage, hydrocephalus, extra-axial collection or mass lesion/mass effect. Vascular: No hyperdense vessel or unexpected calcification. Skull: Normal. Negative for fracture or focal lesion. Sinuses/Orbits: No acute finding. Other: None. IMPRESSION: No acute intracranial pathology. Electronically Signed   By: Suzen Dials M.D.   On: 09/18/2024 14:23      Reassessment and Plan:   CT does not show any acute intracranial bleed or any other concerning findings, lab evaluation at this time does not show any concerning findings as well, though the B12 and serum magnesium are pending.  Based on findings and physical exam, she is completely neurologically intact and does not have any current deficits.  Still concern for potential for TIA or occult CVA, plan at this time is to transfer ED to ED for MRI imaging as this is unavailable at current facility.  Plan discussed with the patient who verbalizes understanding and agreement has no further concerns, request POV transfer and find that as she is stable and has no other concerning findings on exam  or workup that this is safe for her at this time.  Anticipate neurology consultation post MRI, disposition pending results of MRI and consultation.       Final diagnoses:  None    ED Discharge Orders     None          Myriam Dorn [place], [place] 09/18/24 1559  "

## 2024-09-18 NOTE — Discharge Instructions (Addendum)
 Today you were seen in the ED for headache with visual disturbances.  Your CT and MRI were reassuring that there is nothing acute that needs management in the hospital.  Headache instructions:  1. Medications: I have sent a prescription for Compazine  and Naproxen . 2. Treatment: rest, drink plenty of fluids, if headache persists take Compazine  and Naproxen  as directed. 3. Follow Up: Please followup with neurology within 1 week for discussion of your diagnoses and further evaluation after today's visit; if you do not have a primary care doctor use the resource guide provided to find one.  Please return immediately to the ER if you experience new eye pain, double vision or other vision changes, changes in speech or ability to walk, persistent vomiting, neck stiffness, fever, loss of consciousness, or other new or concerning symptom.

## 2024-09-18 NOTE — ED Notes (Signed)
 Pt with headache x 2 weeks; periperal vision sparks since this morning; also felt like I was walking in sand after the shower. These symptoms have been intermittent over the 2 weeks.

## 2024-10-16 ENCOUNTER — Ambulatory Visit: Payer: Self-pay | Admitting: Neurology

## 2024-11-01 ENCOUNTER — Ambulatory Visit: Admitting: Radiology
